# Patient Record
Sex: Female | Born: 1950 | Race: White | Hispanic: No | Smoking: Never smoker
Health system: Southern US, Community
[De-identification: ages and names within clinical notes are randomized; demographics above are authoritative.]

## PROBLEM LIST (undated history)

## (undated) DIAGNOSIS — M779 Enthesopathy, unspecified: Secondary | ICD-10-CM

## (undated) DIAGNOSIS — R112 Nausea with vomiting, unspecified: Secondary | ICD-10-CM

## (undated) DIAGNOSIS — I10 Essential (primary) hypertension: Secondary | ICD-10-CM

## (undated) DIAGNOSIS — F419 Anxiety disorder, unspecified: Secondary | ICD-10-CM

## (undated) DIAGNOSIS — G4730 Sleep apnea, unspecified: Secondary | ICD-10-CM

## (undated) DIAGNOSIS — M1990 Unspecified osteoarthritis, unspecified site: Secondary | ICD-10-CM

## (undated) DIAGNOSIS — K2270 Barrett's esophagus without dysplasia: Secondary | ICD-10-CM

## (undated) DIAGNOSIS — Z9889 Other specified postprocedural states: Secondary | ICD-10-CM

## (undated) DIAGNOSIS — E785 Hyperlipidemia, unspecified: Secondary | ICD-10-CM

## (undated) HISTORY — DX: Hyperlipidemia, unspecified: E78.5

## (undated) HISTORY — DX: Barrett's esophagus without dysplasia: K22.70

## (undated) HISTORY — PX: POLYPECTOMY: SHX149

## (undated) HISTORY — PX: TONSILLECTOMY: SUR1361

## (undated) HISTORY — PX: COLONOSCOPY: SHX174

## (undated) HISTORY — DX: Sleep apnea, unspecified: G47.30

## (undated) HISTORY — DX: Unspecified osteoarthritis, unspecified site: M19.90

---

## 2001-06-30 ENCOUNTER — Encounter: Payer: Self-pay | Admitting: Obstetrics and Gynecology

## 2001-06-30 ENCOUNTER — Encounter: Admission: RE | Admit: 2001-06-30 | Discharge: 2001-06-30 | Payer: Self-pay | Admitting: Obstetrics and Gynecology

## 2001-07-27 ENCOUNTER — Encounter: Payer: Self-pay | Admitting: Obstetrics and Gynecology

## 2001-07-27 ENCOUNTER — Encounter: Admission: RE | Admit: 2001-07-27 | Discharge: 2001-07-27 | Payer: Self-pay | Admitting: Obstetrics and Gynecology

## 2002-07-05 ENCOUNTER — Encounter: Payer: Self-pay | Admitting: Obstetrics and Gynecology

## 2002-07-05 ENCOUNTER — Encounter: Admission: RE | Admit: 2002-07-05 | Discharge: 2002-07-05 | Payer: Self-pay | Admitting: Obstetrics and Gynecology

## 2003-03-01 ENCOUNTER — Other Ambulatory Visit: Admission: RE | Admit: 2003-03-01 | Discharge: 2003-03-01 | Payer: Self-pay | Admitting: Obstetrics and Gynecology

## 2003-08-29 ENCOUNTER — Encounter: Admission: RE | Admit: 2003-08-29 | Discharge: 2003-08-29 | Payer: Self-pay | Admitting: Obstetrics and Gynecology

## 2004-07-01 HISTORY — PX: CHOLECYSTECTOMY: SHX55

## 2004-10-17 ENCOUNTER — Encounter: Admission: RE | Admit: 2004-10-17 | Discharge: 2004-10-17 | Payer: Self-pay | Admitting: Obstetrics and Gynecology

## 2004-10-18 ENCOUNTER — Other Ambulatory Visit: Admission: RE | Admit: 2004-10-18 | Discharge: 2004-10-18 | Payer: Self-pay | Admitting: Obstetrics and Gynecology

## 2004-10-29 ENCOUNTER — Encounter: Admission: RE | Admit: 2004-10-29 | Discharge: 2004-10-29 | Payer: Self-pay | Admitting: Obstetrics and Gynecology

## 2004-12-04 ENCOUNTER — Encounter: Admission: RE | Admit: 2004-12-04 | Discharge: 2004-12-04 | Payer: Self-pay | Admitting: Obstetrics and Gynecology

## 2004-12-19 ENCOUNTER — Encounter: Admission: RE | Admit: 2004-12-19 | Discharge: 2004-12-19 | Payer: Self-pay | Admitting: General Surgery

## 2005-10-28 ENCOUNTER — Encounter: Admission: RE | Admit: 2005-10-28 | Discharge: 2005-10-28 | Payer: Self-pay | Admitting: Obstetrics and Gynecology

## 2005-10-28 ENCOUNTER — Other Ambulatory Visit: Admission: RE | Admit: 2005-10-28 | Discharge: 2005-10-28 | Payer: Self-pay | Admitting: Obstetrics and Gynecology

## 2006-12-30 ENCOUNTER — Encounter: Admission: RE | Admit: 2006-12-30 | Discharge: 2006-12-30 | Payer: Self-pay | Admitting: Obstetrics and Gynecology

## 2006-12-30 ENCOUNTER — Other Ambulatory Visit: Admission: RE | Admit: 2006-12-30 | Discharge: 2006-12-30 | Payer: Self-pay | Admitting: Obstetrics and Gynecology

## 2007-12-31 ENCOUNTER — Encounter: Admission: RE | Admit: 2007-12-31 | Discharge: 2007-12-31 | Payer: Self-pay | Admitting: Obstetrics and Gynecology

## 2007-12-31 ENCOUNTER — Other Ambulatory Visit: Admission: RE | Admit: 2007-12-31 | Discharge: 2007-12-31 | Payer: Self-pay | Admitting: Obstetrics and Gynecology

## 2009-01-03 ENCOUNTER — Other Ambulatory Visit: Admission: RE | Admit: 2009-01-03 | Discharge: 2009-01-03 | Payer: Self-pay | Admitting: Obstetrics and Gynecology

## 2009-01-03 ENCOUNTER — Encounter: Admission: RE | Admit: 2009-01-03 | Discharge: 2009-01-03 | Payer: Self-pay | Admitting: Obstetrics and Gynecology

## 2010-01-09 ENCOUNTER — Encounter: Admission: RE | Admit: 2010-01-09 | Discharge: 2010-01-09 | Payer: Self-pay | Admitting: Obstetrics and Gynecology

## 2010-02-01 ENCOUNTER — Other Ambulatory Visit: Admission: RE | Admit: 2010-02-01 | Discharge: 2010-02-01 | Payer: Self-pay | Admitting: Obstetrics and Gynecology

## 2010-12-17 ENCOUNTER — Other Ambulatory Visit: Payer: Self-pay | Admitting: Obstetrics and Gynecology

## 2010-12-17 DIAGNOSIS — Z1231 Encounter for screening mammogram for malignant neoplasm of breast: Secondary | ICD-10-CM

## 2011-01-11 ENCOUNTER — Ambulatory Visit
Admission: RE | Admit: 2011-01-11 | Discharge: 2011-01-11 | Disposition: A | Discharge status: Home / Self Care | Payer: BC Managed Care – PPO | Source: Ambulatory Visit | Attending: Obstetrics and Gynecology | Admitting: Obstetrics and Gynecology

## 2011-01-11 DIAGNOSIS — Z1231 Encounter for screening mammogram for malignant neoplasm of breast: Secondary | ICD-10-CM

## 2011-02-04 ENCOUNTER — Other Ambulatory Visit: Payer: Self-pay | Admitting: Obstetrics and Gynecology

## 2011-02-04 ENCOUNTER — Other Ambulatory Visit (HOSPITAL_COMMUNITY)
Admission: RE | Admit: 2011-02-04 | Discharge: 2011-02-04 | Disposition: A | Discharge status: Home / Self Care | Payer: BC Managed Care – PPO | Source: Ambulatory Visit | Attending: Obstetrics and Gynecology | Admitting: Obstetrics and Gynecology

## 2011-02-04 DIAGNOSIS — Z01419 Encounter for gynecological examination (general) (routine) without abnormal findings: Secondary | ICD-10-CM | POA: Insufficient documentation

## 2011-12-17 ENCOUNTER — Other Ambulatory Visit: Payer: Self-pay | Admitting: Obstetrics and Gynecology

## 2011-12-17 DIAGNOSIS — Z803 Family history of malignant neoplasm of breast: Secondary | ICD-10-CM

## 2011-12-17 DIAGNOSIS — Z1231 Encounter for screening mammogram for malignant neoplasm of breast: Secondary | ICD-10-CM

## 2012-01-14 ENCOUNTER — Ambulatory Visit: Payer: BC Managed Care – PPO

## 2012-01-30 ENCOUNTER — Ambulatory Visit
Admission: RE | Admit: 2012-01-30 | Discharge: 2012-01-30 | Disposition: A | Discharge status: Home / Self Care | Payer: BC Managed Care – PPO | Source: Ambulatory Visit | Attending: Obstetrics and Gynecology | Admitting: Obstetrics and Gynecology

## 2012-01-30 DIAGNOSIS — Z803 Family history of malignant neoplasm of breast: Secondary | ICD-10-CM

## 2012-01-30 DIAGNOSIS — Z1231 Encounter for screening mammogram for malignant neoplasm of breast: Secondary | ICD-10-CM

## 2012-02-05 ENCOUNTER — Other Ambulatory Visit: Payer: Self-pay | Admitting: Obstetrics and Gynecology

## 2012-02-05 ENCOUNTER — Other Ambulatory Visit (HOSPITAL_COMMUNITY)
Admission: RE | Admit: 2012-02-05 | Discharge: 2012-02-05 | Disposition: A | Discharge status: Home / Self Care | Payer: BC Managed Care – PPO | Source: Ambulatory Visit | Attending: Obstetrics and Gynecology | Admitting: Obstetrics and Gynecology

## 2012-02-05 DIAGNOSIS — R87810 Cervical high risk human papillomavirus (HPV) DNA test positive: Secondary | ICD-10-CM | POA: Insufficient documentation

## 2012-02-05 DIAGNOSIS — Z01419 Encounter for gynecological examination (general) (routine) without abnormal findings: Secondary | ICD-10-CM | POA: Insufficient documentation

## 2012-04-29 ENCOUNTER — Other Ambulatory Visit (HOSPITAL_COMMUNITY): Payer: Self-pay | Admitting: Orthopaedic Surgery

## 2012-06-12 ENCOUNTER — Encounter (HOSPITAL_COMMUNITY): Payer: Self-pay | Admitting: Pharmacy Technician

## 2012-06-15 ENCOUNTER — Encounter (HOSPITAL_COMMUNITY): Payer: Self-pay

## 2012-06-15 ENCOUNTER — Ambulatory Visit (HOSPITAL_COMMUNITY)
Admission: RE | Admit: 2012-06-15 | Discharge: 2012-06-15 | Disposition: A | Discharge status: Home / Self Care | Payer: BC Managed Care – PPO | Source: Ambulatory Visit | Attending: Orthopaedic Surgery | Admitting: Orthopaedic Surgery

## 2012-06-15 ENCOUNTER — Encounter (HOSPITAL_COMMUNITY)
Admission: RE | Admit: 2012-06-15 | Discharge: 2012-06-15 | Disposition: A | Discharge status: Home / Self Care | Payer: BC Managed Care – PPO | Source: Ambulatory Visit | Attending: Orthopaedic Surgery | Admitting: Orthopaedic Surgery

## 2012-06-15 DIAGNOSIS — Z01818 Encounter for other preprocedural examination: Secondary | ICD-10-CM | POA: Insufficient documentation

## 2012-06-15 DIAGNOSIS — Z01812 Encounter for preprocedural laboratory examination: Secondary | ICD-10-CM | POA: Insufficient documentation

## 2012-06-15 HISTORY — DX: Essential (primary) hypertension: I10

## 2012-06-15 HISTORY — DX: Nausea with vomiting, unspecified: R11.2

## 2012-06-15 HISTORY — DX: Anxiety disorder, unspecified: F41.9

## 2012-06-15 HISTORY — DX: Unspecified osteoarthritis, unspecified site: M19.90

## 2012-06-15 HISTORY — DX: Enthesopathy, unspecified: M77.9

## 2012-06-15 HISTORY — DX: Other specified postprocedural states: Z98.890

## 2012-06-15 LAB — URINALYSIS, ROUTINE W REFLEX MICROSCOPIC
Ketones, ur: NEGATIVE mg/dL
Leukocytes, UA: NEGATIVE
Nitrite: NEGATIVE
Protein, ur: NEGATIVE mg/dL

## 2012-06-15 LAB — BASIC METABOLIC PANEL
BUN: 24 mg/dL — ABNORMAL HIGH (ref 6–23)
Calcium: 9.7 mg/dL (ref 8.4–10.5)
GFR calc Af Amer: 74 mL/min — ABNORMAL LOW (ref >90)
GFR calc non Af Amer: 64 mL/min — ABNORMAL LOW (ref >90)
Glucose, Bld: 86 mg/dL (ref 70–99)
Sodium: 136 mEq/L (ref 135–145)

## 2012-06-15 LAB — CBC
MCH: 29.4 pg (ref 26.0–34.0)
MCHC: 32.6 g/dL (ref 30.0–36.0)
Platelets: 249 10*3/uL (ref 150–400)
RBC: 4.28 MIL/uL (ref 3.87–5.11)

## 2012-06-15 NOTE — Progress Notes (Signed)
06/15/12 1401  OBSTRUCTIVE SLEEP APNEA  Have you ever been diagnosed with sleep apnea through a sleep study? No  Do you snore loudly (loud enough to be heard through closed doors)?  1  Do you often feel tired, fatigued, or sleepy during the daytime? 0  Has anyone observed you stop breathing during your sleep? 1  Do you have, or are you being treated for high blood pressure? 1  BMI more than 35 kg/m2? 0  Age over 63 years old? 1  Neck circumference greater than 40 cm/18 inches? 0  Gender: 0  Obstructive Sleep Apnea Score 4   Score 4 or greater  Results sent to PCP

## 2012-06-15 NOTE — Patient Instructions (Signed)
20 LEIGHANNA KIRN  06/15/2012   Your procedure is scheduled on: 06/19/12  Report to Darrin Nipper at [phone].  Call this number if you have problems the morning of surgery 336-: [phone]   Remember:   Do not eat food or drink liquids After Midnight.     Take these medicines the morning of surgery with A SIP OF WATER: amlodipine, tramadol if needed, depakote    Do not wear jewelry, make-up or nail polish.  Do not wear lotions, powders, or perfumes. You may wear deodorant.  Do not shave 48 hours prior to surgery. Men may shave face and neck.  Do not bring valuables to the hospital.  Contacts, dentures or bridgework may not be worn into surgery.  Leave suitcase in the car. After surgery it may be brought to your room.  For patients admitted to the hospital, checkout time is 11:00 AM the day of discharge.     Special Instructions: Shower using CHG 2 nights before surgery and the night before surgery.  If you shower the day of surgery use CHG.  Use special wash - you have one bottle of CHG for all showers.  You should use approximately 1/3 of the bottle for each shower.   Please read over the following fact sheets that you were given: MRSA Information, blood fact sheet Birdie Sons, RN  pre op nurse call if needed [phone]    FAILURE TO FOLLOW THESE INSTRUCTIONS MAY RESULT IN CANCELLATION OF YOUR SURGERY   Patient Signature: ___________________________________________

## 2012-06-19 ENCOUNTER — Encounter (HOSPITAL_COMMUNITY): Payer: Self-pay | Admitting: *Deleted

## 2012-06-19 ENCOUNTER — Encounter (HOSPITAL_COMMUNITY)
Admission: RE | Disposition: A | Discharge status: Home Health | Payer: Self-pay | Source: Ambulatory Visit | Attending: Orthopaedic Surgery

## 2012-06-19 ENCOUNTER — Inpatient Hospital Stay (HOSPITAL_COMMUNITY): Payer: BC Managed Care – PPO | Admitting: Certified Registered Nurse Anesthetist

## 2012-06-19 ENCOUNTER — Inpatient Hospital Stay (HOSPITAL_COMMUNITY)
Admission: RE | Admit: 2012-06-19 | Discharge: 2012-06-21 | DRG: 818 | Disposition: A | Discharge status: Home Health | Payer: BC Managed Care – PPO | Source: Ambulatory Visit | Attending: Orthopaedic Surgery | Admitting: Orthopaedic Surgery

## 2012-06-19 ENCOUNTER — Inpatient Hospital Stay (HOSPITAL_COMMUNITY): Payer: BC Managed Care – PPO

## 2012-06-19 ENCOUNTER — Encounter (HOSPITAL_COMMUNITY): Payer: Self-pay | Admitting: Certified Registered Nurse Anesthetist

## 2012-06-19 DIAGNOSIS — Z791 Long term (current) use of non-steroidal anti-inflammatories (NSAID): Secondary | ICD-10-CM

## 2012-06-19 DIAGNOSIS — Z7982 Long term (current) use of aspirin: Secondary | ICD-10-CM

## 2012-06-19 DIAGNOSIS — M1610 Unilateral primary osteoarthritis, unspecified hip: Principal | ICD-10-CM | POA: Diagnosis present

## 2012-06-19 DIAGNOSIS — F429 Obsessive-compulsive disorder, unspecified: Secondary | ICD-10-CM | POA: Diagnosis present

## 2012-06-19 DIAGNOSIS — Z880 Allergy status to penicillin: Secondary | ICD-10-CM

## 2012-06-19 DIAGNOSIS — D62 Acute posthemorrhagic anemia: Secondary | ICD-10-CM | POA: Diagnosis not present

## 2012-06-19 DIAGNOSIS — Z9089 Acquired absence of other organs: Secondary | ICD-10-CM

## 2012-06-19 DIAGNOSIS — I10 Essential (primary) hypertension: Secondary | ICD-10-CM | POA: Diagnosis present

## 2012-06-19 DIAGNOSIS — Z79899 Other long term (current) drug therapy: Secondary | ICD-10-CM

## 2012-06-19 DIAGNOSIS — M169 Osteoarthritis of hip, unspecified: Secondary | ICD-10-CM | POA: Diagnosis present

## 2012-06-19 DIAGNOSIS — M5380 Other specified dorsopathies, site unspecified: Secondary | ICD-10-CM | POA: Diagnosis present

## 2012-06-19 HISTORY — PX: TOTAL HIP ARTHROPLASTY: SHX124

## 2012-06-19 LAB — TYPE AND SCREEN: Antibody Screen: NEGATIVE

## 2012-06-19 LAB — ABO/RH: ABO/RH(D): AB POS

## 2012-06-19 SURGERY — ARTHROPLASTY, HIP, TOTAL, ANTERIOR APPROACH
Anesthesia: General | Site: Hip | Laterality: Right | Wound class: Clean

## 2012-06-19 MED ORDER — DOCUSATE SODIUM 100 MG PO CAPS
100.0000 mg | ORAL_CAPSULE | Freq: Two times a day (BID) | ORAL | Status: DC
  Administered 2012-06-19 – 2012-06-21 (×4): 100 mg via ORAL

## 2012-06-19 MED ORDER — ATORVASTATIN CALCIUM 40 MG PO TABS
40.0000 mg | ORAL_TABLET | Freq: Every evening | ORAL | Status: DC
  Administered 2012-06-19 – 2012-06-20 (×2): 40 mg via ORAL
  Filled 2012-06-19 (×3): qty 1

## 2012-06-19 MED ORDER — HYDROMORPHONE HCL PF 1 MG/ML IJ SOLN
INTRAMUSCULAR | Status: AC
  Filled 2012-06-19: qty 1

## 2012-06-19 MED ORDER — METOCLOPRAMIDE HCL 10 MG PO TABS: 5.0000 mg | ORAL_TABLET | Freq: Three times a day (TID) | ORAL | Status: DC | PRN

## 2012-06-19 MED ORDER — ZOLPIDEM TARTRATE 5 MG PO TABS: 5.0000 mg | ORAL_TABLET | Freq: Every evening | ORAL | Status: DC | PRN

## 2012-06-19 MED ORDER — EPHEDRINE SULFATE 50 MG/ML IJ SOLN
INTRAMUSCULAR | Status: DC | PRN
  Administered 2012-06-19: 5 mg via INTRAVENOUS

## 2012-06-19 MED ORDER — HYDROMORPHONE HCL PF 1 MG/ML IJ SOLN: 0.2500 mg | INTRAMUSCULAR | Status: DC | PRN

## 2012-06-19 MED ORDER — MIDAZOLAM HCL 5 MG/5ML IJ SOLN: INTRAMUSCULAR | Status: DC | PRN

## 2012-06-19 MED ORDER — OXYCODONE HCL ER 10 MG PO T12A
10.0000 mg | EXTENDED_RELEASE_TABLET | Freq: Two times a day (BID) | ORAL | Status: DC
  Administered 2012-06-19 – 2012-06-21 (×4): 10 mg via ORAL
  Filled 2012-06-19 (×4): qty 1

## 2012-06-19 MED ORDER — FENTANYL CITRATE 0.05 MG/ML IJ SOLN
INTRAMUSCULAR | Status: DC | PRN
  Administered 2012-06-19: 100 ug via INTRAVENOUS
  Administered 2012-06-19 (×2): 50 ug via INTRAVENOUS

## 2012-06-19 MED ORDER — SODIUM CHLORIDE 0.9 % IV SOLN
INTRAVENOUS | Status: DC
  Administered 2012-06-19: 12:00:00 via INTRAVENOUS

## 2012-06-19 MED ORDER — DIPHENHYDRAMINE HCL 12.5 MG/5ML PO ELIX: 12.5000 mg | ORAL_SOLUTION | ORAL | Status: DC | PRN

## 2012-06-19 MED ORDER — ONDANSETRON HCL 4 MG/2ML IJ SOLN: 4.0000 mg | Freq: Four times a day (QID) | INTRAMUSCULAR | Status: DC | PRN

## 2012-06-19 MED ORDER — METOCLOPRAMIDE HCL 5 MG/ML IJ SOLN
INTRAMUSCULAR | Status: DC | PRN
  Administered 2012-06-19: 5 mg via INTRAVENOUS

## 2012-06-19 MED ORDER — METOCLOPRAMIDE HCL 5 MG/ML IJ SOLN: 5.0000 mg | Freq: Three times a day (TID) | INTRAMUSCULAR | Status: DC | PRN

## 2012-06-19 MED ORDER — KETOROLAC TROMETHAMINE 15 MG/ML IJ SOLN
15.0000 mg | Freq: Four times a day (QID) | INTRAMUSCULAR | Status: AC
  Administered 2012-06-19 – 2012-06-20 (×4): 15 mg via INTRAVENOUS
  Filled 2012-06-19 (×4): qty 1

## 2012-06-19 MED ORDER — ACETAMINOPHEN 10 MG/ML IV SOLN
INTRAVENOUS | Status: DC | PRN
  Administered 2012-06-19: 1000 mg via INTRAVENOUS

## 2012-06-19 MED ORDER — ESCITALOPRAM OXALATE 20 MG PO TABS
20.0000 mg | ORAL_TABLET | Freq: Every evening | ORAL | Status: DC
  Administered 2012-06-19 – 2012-06-20 (×2): 20 mg via ORAL
  Filled 2012-06-19 (×3): qty 1

## 2012-06-19 MED ORDER — MENTHOL 3 MG MT LOZG: 1.0000 | LOZENGE | OROMUCOSAL | Status: DC | PRN

## 2012-06-19 MED ORDER — OXYCODONE HCL 5 MG PO TABS
5.0000 mg | ORAL_TABLET | ORAL | Status: DC | PRN
  Administered 2012-06-20 (×2): 5 mg via ORAL
  Filled 2012-06-19: qty 2
  Filled 2012-06-19: qty 1

## 2012-06-19 MED ORDER — PROPOFOL 10 MG/ML IV BOLUS
INTRAVENOUS | Status: DC | PRN
  Administered 2012-06-19: 140 mg via INTRAVENOUS

## 2012-06-19 MED ORDER — METHOCARBAMOL 100 MG/ML IJ SOLN
500.0000 mg | Freq: Four times a day (QID) | INTRAVENOUS | Status: DC | PRN
  Administered 2012-06-19: 500 mg via INTRAVENOUS
  Filled 2012-06-19: qty 5

## 2012-06-19 MED ORDER — SUCCINYLCHOLINE CHLORIDE 20 MG/ML IJ SOLN
INTRAMUSCULAR | Status: DC | PRN
  Administered 2012-06-19: 100 mg via INTRAVENOUS

## 2012-06-19 MED ORDER — PROMETHAZINE HCL 25 MG/ML IJ SOLN: 6.2500 mg | INTRAMUSCULAR | Status: DC | PRN

## 2012-06-19 MED ORDER — ONDANSETRON HCL 4 MG/2ML IJ SOLN
INTRAMUSCULAR | Status: DC | PRN
  Administered 2012-06-19: 4 mg via INTRAVENOUS

## 2012-06-19 MED ORDER — DIVALPROEX SODIUM ER 500 MG PO TB24
500.0000 mg | ORAL_TABLET | Freq: Every day | ORAL | Status: DC
  Administered 2012-06-20 – 2012-06-21 (×2): 500 mg via ORAL
  Filled 2012-06-19 (×3): qty 1

## 2012-06-19 MED ORDER — ONDANSETRON HCL 4 MG PO TABS: 4.0000 mg | ORAL_TABLET | Freq: Four times a day (QID) | ORAL | Status: DC | PRN

## 2012-06-19 MED ORDER — ACETAMINOPHEN 650 MG RE SUPP: 650.0000 mg | Freq: Four times a day (QID) | RECTAL | Status: DC | PRN

## 2012-06-19 MED ORDER — 0.9 % SODIUM CHLORIDE (POUR BTL) OPTIME
TOPICAL | Status: DC | PRN
  Administered 2012-06-19: 1000 mL

## 2012-06-19 MED ORDER — ASPIRIN EC 325 MG PO TBEC
325.0000 mg | DELAYED_RELEASE_TABLET | Freq: Two times a day (BID) | ORAL | Status: DC
  Administered 2012-06-20 – 2012-06-21 (×3): 325 mg via ORAL
  Filled 2012-06-19 (×5): qty 1

## 2012-06-19 MED ORDER — METHOCARBAMOL 500 MG PO TABS
500.0000 mg | ORAL_TABLET | Freq: Four times a day (QID) | ORAL | Status: DC | PRN
  Administered 2012-06-19: 500 mg via ORAL
  Filled 2012-06-19: qty 1

## 2012-06-19 MED ORDER — ALUM & MAG HYDROXIDE-SIMETH 200-200-20 MG/5ML PO SUSP: 30.0000 mL | ORAL | Status: DC | PRN

## 2012-06-19 MED ORDER — PHENOL 1.4 % MT LIQD: 1.0000 | OROMUCOSAL | Status: DC | PRN

## 2012-06-19 MED ORDER — LIDOCAINE HCL 1 % IJ SOLN
INTRAMUSCULAR | Status: DC | PRN
  Administered 2012-06-19: 80 mg via INTRADERMAL

## 2012-06-19 MED ORDER — MIDAZOLAM HCL 5 MG/5ML IJ SOLN
INTRAMUSCULAR | Status: DC | PRN
  Administered 2012-06-19: 2 mg via INTRAVENOUS

## 2012-06-19 MED ORDER — HYDROMORPHONE HCL PF 1 MG/ML IJ SOLN
1.0000 mg | INTRAMUSCULAR | Status: DC | PRN
  Administered 2012-06-19: 0.5 mg via INTRAVENOUS

## 2012-06-19 MED ORDER — AMLODIPINE BESYLATE 5 MG PO TABS
5.0000 mg | ORAL_TABLET | Freq: Every day | ORAL | Status: DC
  Administered 2012-06-21: 5 mg via ORAL
  Filled 2012-06-19 (×3): qty 1

## 2012-06-19 MED ORDER — CLINDAMYCIN PHOSPHATE 900 MG/50ML IV SOLN
900.0000 mg | INTRAVENOUS | Status: AC
  Administered 2012-06-19: 900 mg via INTRAVENOUS

## 2012-06-19 MED ORDER — CLINDAMYCIN PHOSPHATE 600 MG/50ML IV SOLN
600.0000 mg | Freq: Four times a day (QID) | INTRAVENOUS | Status: AC
  Administered 2012-06-19 (×2): 600 mg via INTRAVENOUS
  Filled 2012-06-19 (×2): qty 50

## 2012-06-19 MED ORDER — DEXAMETHASONE SODIUM PHOSPHATE 10 MG/ML IJ SOLN
INTRAMUSCULAR | Status: DC | PRN
  Administered 2012-06-19: 10 mg via INTRAVENOUS

## 2012-06-19 MED ORDER — ACETAMINOPHEN 325 MG PO TABS
650.0000 mg | ORAL_TABLET | Freq: Four times a day (QID) | ORAL | Status: DC | PRN
  Administered 2012-06-20 – 2012-06-21 (×2): 650 mg via ORAL
  Filled 2012-06-19 (×2): qty 2

## 2012-06-19 MED ORDER — LACTATED RINGERS IV SOLN
INTRAVENOUS | Status: DC | PRN
  Administered 2012-06-19: 08:00:00 via INTRAVENOUS
  Administered 2012-06-19: 1000 mL
  Administered 2012-06-19: 07:00:00 via INTRAVENOUS

## 2012-06-19 MED ORDER — CISATRACURIUM BESYLATE (PF) 10 MG/5ML IV SOLN
INTRAVENOUS | Status: DC | PRN
  Administered 2012-06-19: 6 mg via INTRAVENOUS

## 2012-06-19 SURGICAL SUPPLY — 36 items
BAG ZIPLOCK 12X15 (MISCELLANEOUS) ×4 IMPLANT
BLADE SAW SGTL 18X1.27X75 (BLADE) ×2 IMPLANT
CELLS DAT CNTRL 66122 CELL SVR (MISCELLANEOUS) ×1 IMPLANT
CLOTH BEACON ORANGE TIMEOUT ST (SAFETY) ×2 IMPLANT
DRAPE C-ARM 42X72 X-RAY (DRAPES) ×2 IMPLANT
DRAPE STERI IOBAN 125X83 (DRAPES) ×2 IMPLANT
DRAPE U-SHAPE 47X51 STRL (DRAPES) ×6 IMPLANT
DRSG MEPILEX BORDER 4X8 (GAUZE/BANDAGES/DRESSINGS) ×2 IMPLANT
DURAPREP 26ML APPLICATOR (WOUND CARE) ×2 IMPLANT
ELECT BLADE TIP CTD 4 INCH (ELECTRODE) ×2 IMPLANT
ELECT REM PT RETURN 9FT ADLT (ELECTROSURGICAL) ×2
ELECTRODE REM PT RTRN 9FT ADLT (ELECTROSURGICAL) ×1 IMPLANT
FACESHIELD LNG OPTICON STERILE (SAFETY) ×8 IMPLANT
GAUZE XEROFORM 1X8 LF (GAUZE/BANDAGES/DRESSINGS) ×2 IMPLANT
GLOVE BIO SURGEON STRL SZ7 (GLOVE) ×2 IMPLANT
GLOVE BIO SURGEON STRL SZ7.5 (GLOVE) ×2 IMPLANT
GLOVE BIOGEL PI IND STRL 7.5 (GLOVE) IMPLANT
GLOVE BIOGEL PI IND STRL 8 (GLOVE) ×1 IMPLANT
GLOVE BIOGEL PI INDICATOR 7.5 (GLOVE)
GLOVE BIOGEL PI INDICATOR 8 (GLOVE) ×1
GLOVE ECLIPSE 7.0 STRL STRAW (GLOVE) ×2 IMPLANT
GOWN STRL REIN XL XLG (GOWN DISPOSABLE) ×4 IMPLANT
KIT BASIN OR (CUSTOM PROCEDURE TRAY) ×2 IMPLANT
PACK TOTAL JOINT (CUSTOM PROCEDURE TRAY) ×2 IMPLANT
PADDING CAST COTTON 6X4 STRL (CAST SUPPLIES) ×2 IMPLANT
RTRCTR WOUND ALEXIS 18CM MED (MISCELLANEOUS) ×2
STAPLER VISISTAT 35W (STAPLE) ×2 IMPLANT
SUT ETHIBOND NAB CT1 #1 30IN (SUTURE) ×4 IMPLANT
SUT VIC AB 1 CT1 36 (SUTURE) IMPLANT
SUT VIC AB 2-0 CT1 27 (SUTURE) ×1
SUT VIC AB 2-0 CT1 TAPERPNT 27 (SUTURE) ×1 IMPLANT
SUT VLOC 180 0 24IN GS25 (SUTURE) ×4 IMPLANT
TOWEL OR 17X26 10 PK STRL BLUE (TOWEL DISPOSABLE) ×4 IMPLANT
TOWEL OR NON WOVEN STRL DISP B (DISPOSABLE) ×2 IMPLANT
TRAY FOLEY CATH 14FRSI W/METER (CATHETERS) ×2 IMPLANT
WATER STERILE IRR 1500ML POUR (IV SOLUTION) ×4 IMPLANT

## 2012-06-19 NOTE — Anesthesia Postprocedure Evaluation (Signed)
  Anesthesia Post-op Note  Patient: Pamela Wheeler  Procedure(s) Performed: Procedure(s) (LRB): TOTAL HIP ARTHROPLASTY ANTERIOR APPROACH (Right)  Patient Location: PACU  Anesthesia Type: General  Level of Consciousness: awake and alert   Airway and Oxygen Therapy: Patient Spontanous Breathing  Post-op Pain: mild  Post-op Assessment: Post-op Vital signs reviewed, Patient's Cardiovascular Status Stable, Respiratory Function Stable, Patent Airway and No signs of Nausea or vomiting  Last Vitals:  Filed Vitals:   06/19/12 0945  BP: 95/40  Pulse: 93  Temp:   Resp: 18    Post-op Vital Signs: stable   Complications: No apparent anesthesia complications

## 2012-06-19 NOTE — Transfer of Care (Signed)
Immediate Anesthesia Transfer of Care Note  Patient: Pamela Wheeler  Procedure(s) Performed: Procedure(s) (LRB) with comments: TOTAL HIP ARTHROPLASTY ANTERIOR APPROACH (Right) - Right Total Hip Arthroplasty, Anterior Approach (C-Arm)  Patient Location: PACU  Anesthesia Type:General  Level of Consciousness: awake  Airway & Oxygen Therapy: Patient connected to face mask oxygen  Post-op Assessment: Report given to PACU RN  Post vital signs: Reviewed and stable  Complications: No apparent anesthesia complications

## 2012-06-19 NOTE — H&P (Signed)
TOTAL HIP ADMISSION H&P  Patient is admitted for left total hip arthroplasty.  Subjective:  Chief Complaint: left hip pain  HPI: Pamela Wheeler, 61 y.o. female, has a history of pain and functional disability in the left hip(s) due to arthritis and patient has failed non-surgical conservative treatments for greater than 12 weeks to include NSAID's and/or analgesics, corticosteriod injections, use of assistive devices and activity modification.  Onset of symptoms was gradual starting 5 years ago with gradually worsening course since that time.The patient noted no past surgery on the left hip(s).  Patient currently rates pain in the left hip at 8 out of 10 with activity. Patient has night pain, worsening of pain with activity and weight bearing, pain that interfers with activities of daily living and pain with passive range of motion. Patient has evidence of subchondral cysts, subchondral sclerosis, periarticular osteophytes and joint space narrowing by imaging studies. This condition presents safety issues increasing the risk of falls..  There is no current active infection.  Patient Active Problem List   Diagnosis Date Noted  . Degenerative arthritis of hip 06/19/2012   Past Medical History  Diagnosis Date  . Hypertension   . Anxiety     OCD  . Arthritis   . Bone spur of other site     spine,  l-5  . PONV (postoperative nausea and vomiting)     Past Surgical History  Procedure Date  . Cholecystectomy 2006  . Tonsillectomy     Prescriptions prior to admission  Medication Sig Dispense Refill  . amLODipine (NORVASC) 5 MG tablet Take 5 mg by mouth daily before breakfast.      . aspirin EC 81 MG tablet Take 81 mg by mouth daily.      . Calcium Carbonate-Vitamin D (CALTRATE 600+D PO) Take 1 tablet by mouth 2 (two) times daily.      . Coenzyme Q10 (CO Q 10 PO) Take 1 tablet by mouth every evening.       . divalproex (DEPAKOTE ER) 500 MG 24 hr tablet Take 500 mg by mouth daily.       Marland Kitchen escitalopram (LEXAPRO) 20 MG tablet Take 20 mg by mouth every evening.      . Multiple Vitamins-Minerals (CENTRUM SILVER PO) Take 1 tablet by mouth daily.      . traMADol (ULTRAM) 50 MG tablet Take 50 mg by mouth daily.      Marland Kitchen atorvastatin (LIPITOR) 40 MG tablet Take 40 mg by mouth every evening.      . diclofenac (VOLTAREN) 75 MG EC tablet Take 75 mg by mouth daily.       Allergies  Allergen Reactions  . Penicillins Other (See Comments)    UNKNOWN    History  Substance Use Topics  . Smoking status: Never Smoker   . Smokeless tobacco: Never Used  . Alcohol Use: No    History reviewed. No pertinent family history.   Review of Systems  Musculoskeletal: Positive for joint pain.  All other systems reviewed and are negative.    Objective:  Physical Exam  Constitutional: She is oriented to person, place, and time. She appears well-developed and well-nourished.  HENT:  Head: Normocephalic and atraumatic.  Eyes: EOM are normal.  Neck: Normal range of motion. Neck supple.  Cardiovascular: Normal rate and regular rhythm.   Respiratory: Effort normal and breath sounds normal.  GI: Soft. Bowel sounds are normal.  Musculoskeletal:       Left hip: She exhibits decreased range  of motion, decreased strength, bony tenderness and crepitus.  Neurological: She is alert and oriented to person, place, and time.  Skin: Skin is warm and dry.  Psychiatric: She has a normal mood and affect.    Vital signs in last 24 hours: Temp:  [98.2 F (36.8 C)] 98.2 F (36.8 C) (12/20 0515) Pulse Rate:  [89] 89  (12/20 0515) Resp:  [18] 18  (12/20 0515) BP: (126)/(50) 126/50 mmHg (12/20 0515) SpO2:  [98 %] 98 % (12/20 0515)  Labs:   There is no height or weight on file to calculate BMI.   Imaging Review Plain radiographs demonstrate severe degenerative joint disease of the left hip(s). The bone quality appears to be excellent for age and reported activity level.  Assessment/Plan:  End  stage arthritis, left hip(s)  The patient history, physical examination, clinical judgement of the provider and imaging studies are consistent with end stage degenerative joint disease of the left hip(s) and total hip arthroplasty is deemed medically necessary. The treatment options including medical management, injection therapy, arthroscopy and arthroplasty were discussed at length. The risks and benefits of total hip arthroplasty were presented and reviewed. The risks due to aseptic loosening, infection, stiffness, dislocation/subluxation,  thromboembolic complications and other imponderables were discussed.  The patient acknowledged the explanation, agreed to proceed with the plan and consent was signed. Patient is being admitted for inpatient treatment for surgery, pain control, PT, OT, prophylactic antibiotics, VTE prophylaxis, progressive ambulation and ADL's and discharge planning.The patient is planning to be discharged home with home health services

## 2012-06-19 NOTE — Anesthesia Procedure Notes (Signed)
Procedure Name: Intubation Date/Time: 06/19/2012 7:32 AM Performed by: Hulan Fess Pre-anesthesia Checklist: Patient identified, Emergency Drugs available, Suction available, Patient being monitored and Timeout performed Patient Re-evaluated:Patient Re-evaluated prior to inductionOxygen Delivery Method: Circle system utilized Preoxygenation: Pre-oxygenation with 100% oxygen Intubation Type: IV induction Ventilation: Mask ventilation without difficulty Laryngoscope Size: Mac and 3 Grade View: Grade II Tube type: Oral Tube size: 7.5 mm Number of attempts: 2 Airway Equipment and Method: Stylet Placement Confirmation: ETT inserted through vocal cords under direct vision and positive ETCO2 Secured at: 21 cm Tube secured with: Tape Dental Injury: Teeth and Oropharynx as per pre-operative assessment

## 2012-06-19 NOTE — Plan of Care (Signed)
Problem: Consults Goal: Diagnosis- Total Joint Replacement Right anterior hip     

## 2012-06-19 NOTE — H&P (Signed)
TOTAL HIP ADMISSION H&P  Patient is admitted for right total hip arthroplasty.  Subjective:  Chief Complaint: right hip pain  HPI: Pamela Wheeler, 61 y.o. female, has a history of pain and functional disability in the right hip(s) due to arthritis and patient has failed non-surgical conservative treatments for greater than 12 weeks to include NSAID's and/or analgesics and activity modification.  Onset of symptoms was gradual starting 4 years ago with gradually worsening course since that time.The patient noted no past surgery on the right hip(s).  Patient currently rates pain in the right hip at 10 out of 10 with activity. Patient has night pain, worsening of pain with activity and weight bearing, trendelenberg gait, pain that interfers with activities of daily living, pain with passive range of motion and crepitus. Patient has evidence of subchondral cysts, subchondral sclerosis, periarticular osteophytes and joint space narrowing by imaging studies. This condition presents safety issues increasing the risk of falls.  There is no current active infection.  Patient Active Problem List   Diagnosis Date Noted  . Degenerative arthritis of hip 06/19/2012   Past Medical History  Diagnosis Date  . Hypertension   . Anxiety     OCD  . Arthritis   . Bone spur of other site     spine,  l-5  . PONV (postoperative nausea and vomiting)     Past Surgical History  Procedure Date  . Cholecystectomy 2006  . Tonsillectomy     Prescriptions prior to admission  Medication Sig Dispense Refill  . amLODipine (NORVASC) 5 MG tablet Take 5 mg by mouth daily before breakfast.      . aspirin EC 81 MG tablet Take 81 mg by mouth daily.      . Calcium Carbonate-Vitamin D (CALTRATE 600+D PO) Take 1 tablet by mouth 2 (two) times daily.      . Coenzyme Q10 (CO Q 10 PO) Take 1 tablet by mouth every evening.       . divalproex (DEPAKOTE ER) 500 MG 24 hr tablet Take 500 mg by mouth daily.      Marland Kitchen escitalopram  (LEXAPRO) 20 MG tablet Take 20 mg by mouth every evening.      . Multiple Vitamins-Minerals (CENTRUM SILVER PO) Take 1 tablet by mouth daily.      . traMADol (ULTRAM) 50 MG tablet Take 50 mg by mouth daily.      Marland Kitchen atorvastatin (LIPITOR) 40 MG tablet Take 40 mg by mouth every evening.      . diclofenac (VOLTAREN) 75 MG EC tablet Take 75 mg by mouth daily.       Allergies  Allergen Reactions  . Penicillins Other (See Comments)    UNKNOWN    History  Substance Use Topics  . Smoking status: Never Smoker   . Smokeless tobacco: Never Used  . Alcohol Use: No    History reviewed. No pertinent family history.   Review of Systems  Musculoskeletal: Positive for joint pain.  All other systems reviewed and are negative.    Objective:  Physical Exam  Constitutional: She is oriented to person, place, and time. She appears well-developed and well-nourished.  HENT:  Head: Normocephalic and atraumatic.  Eyes: EOM are normal. Pupils are equal, round, and reactive to light.  Neck: Normal range of motion. Neck supple.  Cardiovascular: Normal rate and regular rhythm.   Respiratory: Effort normal and breath sounds normal.  GI: Soft. Bowel sounds are normal.  Musculoskeletal:       Right  hip: She exhibits decreased range of motion, decreased strength, bony tenderness and crepitus.  Neurological: She is alert and oriented to person, place, and time.  Skin: Skin is warm and dry.  Psychiatric: She has a normal mood and affect.    Vital signs in last 24 hours: Temp:  [98.2 F (36.8 C)] 98.2 F (36.8 C) (12/20 0515) Pulse Rate:  [89] 89  (12/20 0515) Resp:  [18] 18  (12/20 0515) BP: (126)/(50) 126/50 mmHg (12/20 0515) SpO2:  [98 %] 98 % (12/20 0515)  Labs:   There is no height or weight on file to calculate BMI.   Imaging Review Plain radiographs demonstrate severe degenerative joint disease of the right hip(s). The bone quality appears to be excellent for age and reported activity  level.  Assessment/Plan:  End stage arthritis, right hip(s)  The patient history, physical examination, clinical judgement of the provider and imaging studies are consistent with end stage degenerative joint disease of the right hip(s) and total hip arthroplasty is deemed medically necessary. The treatment options including medical management, injection therapy, arthroscopy and arthroplasty were discussed at length. The risks and benefits of total hip arthroplasty were presented and reviewed. The risks due to aseptic loosening, infection, stiffness, dislocation/subluxation,  thromboembolic complications and other imponderables were discussed.  The patient acknowledged the explanation, agreed to proceed with the plan and consent was signed. Patient is being admitted for inpatient treatment for surgery, pain control, PT, OT, prophylactic antibiotics, VTE prophylaxis, progressive ambulation and ADL's and discharge planning.The patient is planning to be discharged home with home health services

## 2012-06-19 NOTE — Evaluation (Signed)
Physical Therapy Evaluation Patient Details Name: Pamela Wheeler MRN: [id number] DOB: 09-25-1950 Today's Date: 06/19/2012 Time: 7829-5621 PT Time Calculation (min): 37 min  PT Assessment / Plan / Recommendation Clinical Impression  Pt s/p R THR presents with decreased R LE strength/ROM and post op pain limiting functional mobility    PT Assessment  Patient needs continued PT services    Follow Up Recommendations  Home health PT    Does the patient have the potential to tolerate intense rehabilitation      Barriers to Discharge None      Equipment Recommendations  None recommended by PT    Recommendations for Other Services OT consult   Frequency 7X/week    Precautions / Restrictions Precautions Precautions: Fall Restrictions Weight Bearing Restrictions: No Other Position/Activity Restrictions: WBAT   Pertinent Vitals/Pain 3/10      Mobility  Bed Mobility Bed Mobility: Supine to Sit Supine to Sit: 4: Min assist Details for Bed Mobility Assistance: cues for sequence and use of L LE to self assist Transfers Transfers: Stand to Sit;Sit to Stand Sit to Stand: 4: Min assist Stand to Sit: 4: Min assist Details for Transfer Assistance: cues for use of UEs and for LE management Ambulation/Gait Ambulation/Gait Assistance: 4: Min assist Ambulation Distance (Feet): 78 Feet Assistive device: Rolling walker Ambulation/Gait Assistance Details: cues for sequence, posture, position from RW Gait Pattern: Step-to pattern    Shoulder Instructions     Exercises Total Joint Exercises Ankle Circles/Pumps: AROM;10 reps;Supine;Both Quad Sets: AROM;10 reps;Both;Supine Heel Slides: AAROM;10 reps;Supine;Right Hip ABduction/ADduction: AAROM;Right;10 reps;Supine   PT Diagnosis: Difficulty walking  PT Problem List: Decreased strength;Decreased range of motion;Decreased activity tolerance;Decreased mobility;Decreased knowledge of use of DME;Pain PT Treatment Interventions:  DME instruction;Gait training;Stair training;Functional mobility training;Therapeutic activities;Therapeutic exercise;Patient/family education   PT Goals Acute Rehab PT Goals PT Goal Formulation: With patient Time For Goal Achievement: 06/23/12 Potential to Achieve Goals: Good Pt will go Supine/Side to Sit: with supervision PT Goal: Supine/Side to Sit - Progress: Goal set today Pt will go Sit to Supine/Side: with supervision PT Goal: Sit to Supine/Side - Progress: Goal set today Pt will go Sit to Stand: with supervision PT Goal: Sit to Stand - Progress: Goal set today Pt will go Stand to Sit: with supervision PT Goal: Stand to Sit - Progress: Goal set today Pt will Ambulate: >150 feet;with supervision;with rolling walker PT Goal: Ambulate - Progress: Goal set today Pt will Go Up / Down Stairs: 1-2 stairs;with min assist;with least restrictive assistive device PT Goal: Up/Down Stairs - Progress: Goal set today  Visit Information  Last PT Received On: 06/19/12 Assistance Needed: +1    Subjective Data  Subjective: I am motivated.  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  Home Living Lives With: Spouse Available Help at Discharge: Family Type of Home: House Home Access: Stairs to enter Secretary/administrator of Steps: 2 Entrance Stairs-Rails: None Home Adaptive Equipment: Walker - rolling Prior Function Level of Independence: Independent Able to Take Stairs?: Yes Driving: Yes Communication Communication: No difficulties Dominant Hand: Right    Cognition  Overall Cognitive Status: Appears within functional limits for tasks assessed/performed Arousal/Alertness: Awake/alert Orientation Level: Appears intact for tasks assessed Behavior During Session: [facility] for tasks performed    Extremity/Trunk Assessment Right Upper Extremity Assessment RUE ROM/Strength/Tone: [facility] for tasks assessed Left Upper Extremity Assessment LUE  ROM/Strength/Tone: [facility] for tasks assessed Right Lower Extremity Assessment RLE ROM/Strength/Tone: Deficits RLE ROM/Strength/Tone  Deficits: Hip strength 3-/5 with AAROM to 80 flex and 20 abd Left Lower Extremity Assessment LLE ROM/Strength/Tone: WFL for tasks assessed   Balance    End of Session PT - End of Session Equipment Utilized During Treatment: Gait belt Activity Tolerance: Patient tolerated treatment well Patient left: in bed;with call bell/phone within reach Nurse Communication: Mobility status  GP     Pierrette Scheu 06/19/2012, 5:36 PM

## 2012-06-19 NOTE — Op Note (Signed)
Pamela, Wheeler         ACCOUNT NO.:  [id number]  MEDICAL RECORD NO.:  [id number]  LOCATION:  1610                         FACILITY:  [facility]  PHYSICIAN:  Vanita Panda. Magnus Ivan, M.D.DATE OF BIRTH:  [birth year]  DATE OF PROCEDURE:  06/19/2012 DATE OF DISCHARGE:                              OPERATIVE REPORT   PREOPERATIVE DIAGNOSIS:  Severe end-stage arthritis and degenerative joint disease, right hip.  POSTOPERATIVE DIAGNOSIS:  Severe end-stage arthritis and degenerative joint disease, right hip.  PROCEDURE:  Right total hip arthroplasty through direct anterior approach.  IMPLANTS:  DePuy Sector Gription acetabular component size 48, size 32+ 4 neutral polyethylene liner, Corail femoral component with varus offset (KLA), size 32+ 1 ceramic hip ball.  SURGEON:  Vanita Panda. Magnus Ivan, MD  ANESTHESIA:  General.  ANTIBIOTICS:  2 g IV Ancef.  BLOOD LOSS:  Less than 500 mL.  COMPLICATIONS:  None.  INDICATIONS:  Pamela Wheeler is a 61 year old with debilitating pain involving her right hip.  It affects her activities of daily living. She has had several falls.  X-rays show complete loss of her joint space with subchondral sclerosis and periarticular osteophytes.  There is also subchondral cyst.  At this point, she wished to proceed with total hip arthroplasty due to her daily pain and her decreased mobility.  Risks and benefits of surgery well explained to her and understood.  She does agree to proceed.  PROCEDURE DESCRIPTION:  After informed consent was obtained, appropriate right hip was marked.  She was brought to the operating room.  General anesthesia was obtained while she was on a stretcher.  Foley catheter was placed and then both feet were placed in traction boot.  She was then placed supine on the Hana fracture table with the perineal post in place and both feet in inline skeletal traction but no traction applied. We used fluoroscopy to assess the hip  given the center of the pelvis and assessing the right hip, so we could obtain leg lengths and positioning of the implants.  We then prepped the right hip with DuraPrep and sterile drapes.  A time-out was called.  She was identified as correct patient, correct right hip.  I then made an incision inferior and posterior to the anterior-superior iliac spine and carried this obliquely down the leg.  I dissected down to the tensor fascia lata and tensor fascia was divided longitudinally.  I then proceeded with a direct anterior approach to the hip.  Cobra retractor was placed in lateral neck and 1 up underneath the medial neck and under the rectus femoris muscle.  I cauterized the lateral femoral circumflex vessels.  I then entered the hip capsule and found large effusion.  I placed the Cobra retractors within the hip capsule.  I then made my femoral neck cut just proximal to the lesser trochanter with an oscillating saw and completed this with an osteotome.  I placed a corkscrew guide in the femoral head and removed the femoral head in its entirety.  I cleaned the acetabulum debris and placed a bent Hohmann medially and a Cobra retractor laterally.  I then began reaming from a size 42 reamer in 2 mm increments up to a size 48 reamer with all  reamers placed under direct visualization and the last reamer placed under direct fluoroscopy, so we could obtain my depth of reaming, my inclination and anteversion.  I then chose a size 48 DePuy sector Gription acetabular component and we placed this into the acetabulum and knocked it into place, verified its placement under direct fluoroscopy as well.  I then placed the apex hole eliminator guide and a single screw.  I then placed a 32+ 4 neutral polyethylene liner.  Attention was then turned to the femur.  With all traction off the leg, the leg was externally rotated to 90 degrees, extended and adducted.  A Mueller retractor was placed medially and  a bent Hohmann behind the greater trochanter.  I released the lateral capsule and the piriformis.  I then used a box cutting guide to enter the femoral canal.  I used a rongeur to lateralize and then began broaching from a size 8 broach up to a size 11.  The 11 was felt to be stable, so I trialed varus offset neck and a 32+ 1 hip ball.  We brought the leg back over and up and with traction internal rotation reduced the hip.  She was stable with internal and external rotation.  Her leg lengths were measured equal under direct fluoroscopy.  We then dislocated the hip and removed the trial components.  I placed the real Corail femoral component size 11 (KLA).  I then placed the real ceramic 32+ 1 hip ball.  We reduced this back in the acetabulum and it was stable.  We copiously irrigated the soft tissues with normal saline solution.  I was able to close the joint capsule with interrupted #1 Ethibond suture followed by 0 V-Loc in the tensor fascia, 2-0 Vicryl in the deep and subcutaneous tissue and staples on the skin.  Well-padded sterile dressing was applied.  She was taken off the [facility] table.  Her leg lengths were again measured equal.  She was awakened, extubated, and taken to recovery room in stable condition.  All final counts were correct.  There were no complications noted.     Vanita Panda. Magnus Ivan, M.D.     CYB/MEDQ  D:  06/19/2012  T:  06/19/2012  Job:  [id number]

## 2012-06-19 NOTE — Brief Op Note (Signed)
06/19/2012  9:18 AM  PATIENT:  Pamela Wheeler  61 y.o. female  PRE-OPERATIVE DIAGNOSIS:  Severe arthritis right hip  POST-OPERATIVE DIAGNOSIS:  Severe arthritis right hip  PROCEDURE:  Procedure(s) (LRB) with comments: TOTAL HIP ARTHROPLASTY ANTERIOR APPROACH (Right) - Right Total Hip Arthroplasty, Anterior Approach (C-Arm)  SURGEON:  Surgeon(s) and Role:    * Kathryne Hitch, MD - Primary  PHYSICIAN ASSISTANT:   ASSISTANTS: none   ANESTHESIA:   general  EBL:  Total I/O In: 1000 [I.V.:1000] Out: 425 [Urine:150; Blood:275]  BLOOD ADMINISTERED:none  DRAINS: none   LOCAL MEDICATIONS USED:  NONE  SPECIMEN:  No Specimen  DISPOSITION OF SPECIMEN:  N/A  COUNTS:  YES  TOURNIQUET:  * No tourniquets in log *  DICTATION: .Other Dictation: Dictation Number [id number]  PLAN OF CARE: Admit to inpatient   PATIENT DISPOSITION:  PACU - hemodynamically stable.   Delay start of Pharmacological VTE agent (>24hrs) due to surgical blood loss or risk of bleeding: no

## 2012-06-19 NOTE — Anesthesia Preprocedure Evaluation (Signed)
Anesthesia Evaluation  Patient identified by MRN, date of birth, ID band Patient awake    Reviewed: Allergy & Precautions, H&P , NPO status , Patient's Chart, lab work & pertinent test results  History of Anesthesia Complications (+) PONV  Airway Mallampati: II TM Distance: <3 FB Neck ROM: Full    Dental No notable dental hx.    Pulmonary neg pulmonary ROS,  breath sounds clear to auscultation  Pulmonary exam normal       Cardiovascular hypertension, Pt. on medications Rhythm:Regular Rate:Normal     Neuro/Psych negative neurological ROS  negative psych ROS   GI/Hepatic negative GI ROS, Neg liver ROS,   Endo/Other  negative endocrine ROS  Renal/GU negative Renal ROS  negative genitourinary   Musculoskeletal negative musculoskeletal ROS (+)   Abdominal   Peds negative pediatric ROS (+)  Hematology negative hematology ROS (+)   Anesthesia Other Findings   Reproductive/Obstetrics negative OB ROS                           Anesthesia Physical Anesthesia Plan  ASA: II  Anesthesia Plan: General   Post-op Pain Management:    Induction: Intravenous  Airway Management Planned: Oral ETT  Additional Equipment:   Intra-op Plan:   Post-operative Plan: Extubation in OR  Informed Consent: I have reviewed the patients History and Physical, chart, labs and discussed the procedure including the risks, benefits and alternatives for the proposed anesthesia with the patient or authorized representative who has indicated his/her understanding and acceptance.   Dental advisory given  Plan Discussed with: CRNA and Surgeon  Anesthesia Plan Comments:         Anesthesia Quick Evaluation

## 2012-06-20 LAB — BASIC METABOLIC PANEL
BUN: 15 mg/dL (ref 6–23)
CO2: 27 mEq/L (ref 19–32)
Chloride: 105 mEq/L (ref 96–112)
GFR calc non Af Amer: >90 mL/min (ref >90)
Glucose, Bld: 140 mg/dL — ABNORMAL HIGH (ref 70–99)
Potassium: 4.1 mEq/L (ref 3.5–5.1)
Sodium: 136 mEq/L (ref 135–145)

## 2012-06-20 LAB — CBC
HCT: 29.5 % — ABNORMAL LOW (ref 36.0–46.0)
Hemoglobin: 9.7 g/dL — ABNORMAL LOW (ref 12.0–15.0)
MCHC: 32.9 g/dL (ref 30.0–36.0)
RBC: 3.27 MIL/uL — ABNORMAL LOW (ref 3.87–5.11)
WBC: 8.8 10*3/uL (ref 4.0–10.5)

## 2012-06-20 MED ORDER — METHOCARBAMOL 500 MG PO TABS: 500.0000 mg | ORAL_TABLET | Freq: Four times a day (QID) | ORAL | Status: DC | PRN

## 2012-06-20 MED ORDER — ASPIRIN 325 MG PO TBEC: 325.0000 mg | DELAYED_RELEASE_TABLET | Freq: Two times a day (BID) | ORAL | Status: DC

## 2012-06-20 MED ORDER — OXYCODONE-ACETAMINOPHEN 5-325 MG PO TABS
1.0000 | ORAL_TABLET | ORAL | Status: AC | PRN
Start: 2012-06-20 — End: 2012-06-30

## 2012-06-20 NOTE — Evaluation (Signed)
Occupational Therapy Evaluation Patient Details Name: Pamela Wheeler MRN: [id number] DOB: 1951/02/12 Today's Date: 06/20/2012 Time: 4098-1191 OT Time Calculation (min): 31 min  OT Assessment / Plan / Recommendation Clinical Impression  Pt presents to OT with decreased I with ADL activity s/p THR. Pt will benefit from skilled OT to increase I with ADL activity and return to PLOF    OT Assessment  Patient needs continued OT Services    Follow Up Recommendations  Home health OT       Equipment Recommendations  None recommended by OT       Frequency  Min 2X/week    Precautions / Restrictions Precautions Precautions: Fall Restrictions Weight Bearing Restrictions: No Other Position/Activity Restrictions: WBAT       ADL  Grooming: Simulated;Wash/dry face;Minimal assistance Where Assessed - Grooming: Unsupported standing Lower Body Bathing: Simulated;Moderate assistance (needs AE) Where Assessed - Lower Body Bathing: Unsupported sit to stand Upper Body Dressing: Performed;Set up Where Assessed - Upper Body Dressing: Unsupported sitting Lower Body Dressing: Performed;Moderate assistance Where Assessed - Lower Body Dressing: Unsupported sit to stand Toilet Transfer: Performed;Minimal assistance Toilet Transfer Method: Sit to stand Toilet Transfer Equipment: Comfort height toilet;Grab bars Toileting - Clothing Manipulation and Hygiene: Performed;Minimal assistance Where Assessed - Glass blower/designer Manipulation and Hygiene: Standing    OT Diagnosis: Generalized weakness  OT Problem List: Decreased strength OT Treatment Interventions: Self-care/ADL training;DME and/or AE instruction;Patient/family education   OT Goals Acute Rehab OT Goals OT Goal Formulation: With patient Potential to Achieve Goals: Good ADL Goals Pt Will Perform Grooming: with modified independence;Standing at sink ADL Goal: Grooming - Progress: Goal set today Pt Will Perform Lower Body Bathing:  with modified independence;Sit to stand from bed;with adaptive equipment ADL Goal: Lower Body Bathing - Progress: Goal set today Pt Will Perform Lower Body Dressing: with modified independence ADL Goal: Lower Body Dressing - Progress: Goal set today Pt Will Transfer to Toilet: with modified independence;Comfort height toilet ADL Goal: Toilet Transfer - Progress: Goal set today Pt Will Perform Toileting - Clothing Manipulation: with modified independence ADL Goal: Toileting - Clothing Manipulation - Progress: Goal set today Pt Will Perform Toileting - Hygiene: with modified independence ADL Goal: Toileting - Hygiene - Progress: Goal set today  Visit Information  Last OT Received On: 06/20/12 Assistance Needed: +1    Subjective Data  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   Prior Functioning     Home Living Lives With: Spouse Available Help at Discharge: Family Type of Home: House Home Access: Stairs to enter Secretary/administrator of Steps: 2 Entrance Stairs-Rails: None Home Layout: One level Bathroom Shower/Tub: Health visitor: Standard Home Adaptive Equipment: Walker - rolling;Bedside commode/3-in-1 Prior Function Level of Independence: Independent Able to Take Stairs?: Yes Driving: Yes Communication Communication: No difficulties Dominant Hand: Right            Cognition  Overall Cognitive Status: Appears within functional limits for tasks assessed/performed Arousal/Alertness: Awake/alert Orientation Level: Appears intact for tasks assessed Behavior During Session: [facility] for tasks performed    Extremity/Trunk Assessment Right Upper Extremity Assessment RUE ROM/Strength/Tone: [facility] for tasks assessed Left Upper Extremity Assessment LUE ROM/Strength/Tone: WFL for tasks assessed     Mobility Bed Mobility Bed Mobility: Supine to Sit Supine to Sit: 4: Min assist Details for Bed Mobility Assistance: Assist for RLE out of  bed with cues for hand placement on bed instead of rails to better simulate home environment.  Transfers Transfers:  Sit to Stand;Stand to Sit Sit to Stand: 4: Min guard;From elevated surface;With upper extremity assist;From bed Stand to Sit: 4: Min guard;With upper extremity assist;With armrests;To chair/3-in-1 Details for Transfer Assistance: Min/guard for safety with cues for hand placement and safety when sitting/standing.         Exercise Total Joint Exercises Ankle Circles/Pumps: AROM;Both;20 reps Quad Sets: AROM;Right;10 reps Heel Slides: AAROM;Right;10 reps Hip ABduction/ADduction: AAROM;Right;10 reps Straight Leg Raises: AAROM;Right;10 reps      End of Session OT - End of Session Activity Tolerance: Patient tolerated treatment well Patient left: in bed  GO     Emily Forse, Metro Kung 06/20/2012, 10:36 AM

## 2012-06-20 NOTE — Progress Notes (Signed)
Physical Therapy Treatment Patient Details Name: Pamela Wheeler MRN: [id number] DOB: 04-09-1951 Today's Date: 06/20/2012 Time: 7829-5621 PT Time Calculation (min): 19 min  PT Assessment / Plan / Recommendation Comments on Treatment Session  Pt continues to progress very well and did well with stair training this pm.  May practice again in am before D/C.     Follow Up Recommendations  Home health PT     Does the patient have the potential to tolerate intense rehabilitation     Barriers to Discharge        Equipment Recommendations  None recommended by PT    Recommendations for Other Services    Frequency 7X/week   Plan Discharge plan remains appropriate    Precautions / Restrictions Precautions Precautions: Fall Restrictions Weight Bearing Restrictions: No Other Position/Activity Restrictions: WBAT   Pertinent Vitals/Pain 4/10 pain, ice pack applied    Mobility  Bed Mobility Bed Mobility: Supine to Sit;Sit to Supine Supine to Sit: 4: Min assist Sit to Supine: 4: Min assist Details for Bed Mobility Assistance: Assist for RLE into and out of bed with cues for hand placement and technique.  Pt continues to reach for handrails with cues for placement on bed.  Transfers Transfers: Stand to Sit;Sit to Stand Sit to Stand: 5: Supervision;With upper extremity assist;From bed Stand to Sit: 5: Supervision;With upper extremity assist;To bed Details for Transfer Assistance: Supervision for safety with min cues for hand placement  Ambulation/Gait Ambulation/Gait Assistance: 5: Supervision Ambulation Distance (Feet): 200 Feet Assistive device: Rolling walker Ambulation/Gait Assistance Details: Min cues for upright posture and keeping RW closer to her.  Gait Pattern: Step-to pattern Gait velocity: decreased Stairs: Yes Stairs Assistance: 4: Min guard Stairs Assistance Details (indicate cue type and reason): Cues for sequencing/technique with RW going up backwards with RW.   Stair Management Technique: No rails;Step to pattern;Backwards;Forwards;With walker Number of Stairs: 2     Exercises     PT Diagnosis:    PT Problem List:   PT Treatment Interventions:     PT Goals Acute Rehab PT Goals PT Goal Formulation: With patient Time For Goal Achievement: 06/23/12 Potential to Achieve Goals: Good Pt will go Supine/Side to Sit: with supervision PT Goal: Supine/Side to Sit - Progress: Progressing toward goal Pt will go Sit to Supine/Side: with supervision PT Goal: Sit to Supine/Side - Progress: Progressing toward goal Pt will go Sit to Stand: with supervision PT Goal: Sit to Stand - Progress: Met Pt will go Stand to Sit: with supervision PT Goal: Stand to Sit - Progress: Met Pt will Ambulate: >150 feet;with supervision;with rolling walker PT Goal: Ambulate - Progress: Met Pt will Go Up / Down Stairs: 1-2 stairs;with min assist;with least restrictive assistive device PT Goal: Up/Down Stairs - Progress: Progressing toward goal  Visit Information  Last PT Received On: 06/20/12 Assistance Needed: +1    Subjective Data  Subjective: I got the catheter out.  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

## 2012-06-20 NOTE — Progress Notes (Signed)
Subjective: 1 Day Post-Op Procedure(s) (LRB): TOTAL HIP ARTHROPLASTY ANTERIOR APPROACH (Right) Patient reports pain as mild.  Did well with therapy.  Asymptomatic acute blood loss anemia.  Objective: Vital signs in last 24 hours: Temp:  [97.9 F (36.6 C)-98.6 F (37 C)] 98.2 F (36.8 C) (12/21 0623) Pulse Rate:  [78-99] 79  (12/21 0729) Resp:  [14-17] 17  (12/21 0729) BP: (91-119)/(49-73) 94/57 mmHg (12/21 0729) SpO2:  [95 %-100 %] 100 % (12/21 0729) Weight:  [67.132 kg (148 lb)] 67.132 kg (148 lb) (12/20 1056)  Intake/Output from previous day: 12/20 0701 - 12/21 0700 In: 3942.1 [P.O.:240; I.V.:3547.1; IV Piggyback:155] Out: 1475 [Urine:1200; Blood:275] Intake/Output this shift:     Basename 06/20/12 0430  HGB 9.7*    Basename 06/20/12 0430  WBC 8.8  RBC 3.27*  HCT 29.5*  PLT 189    Basename 06/20/12 0430  NA 136  K 4.1  CL 105  CO2 27  BUN 15  CREATININE 0.65  GLUCOSE 140*  CALCIUM 8.0*   No results found for this basename: LABPT:2,INR:2 in the last 72 hours  Sensation intact distally Intact pulses distally Dorsiflexion/Plantar flexion intact Incision: scant drainage  Assessment/Plan: 1 Day Post-Op Procedure(s) (LRB): TOTAL HIP ARTHROPLASTY ANTERIOR APPROACH (Right) Up with therapy Plan for discharge tomorrow  Kathryne Hitch 06/20/2012, 10:39 AM

## 2012-06-20 NOTE — Progress Notes (Addendum)
Physical Therapy Treatment Patient Details Name: Pamela Wheeler MRN: [id number] DOB: [birth year] Today's Date: 06/20/2012 Time: 2956-2130 PT Time Calculation (min): 26 min  PT Assessment / Plan / Recommendation Comments on Treatment Session  Pt progressing very well with little increase in pain with mobility.  Pt states 2/10 pain with ambulation.  Discussed concerns for keeping foley catheter in during session.  Pt states that she does not want to have it removed. Will practice stairs in pm for possible D/C today vs. Tomorrow.    Follow Up Recommendations  Home health PT     Does the patient have the potential to tolerate intense rehabilitation     Barriers to Discharge        Equipment Recommendations  None recommended by PT    Recommendations for Other Services OT consult  Frequency 7X/week   Plan Discharge plan remains appropriate    Precautions / Restrictions Precautions Precautions: Fall Restrictions Weight Bearing Restrictions: No Other Position/Activity Restrictions: WBAT   Pertinent Vitals/Pain 2/10, ice pack applied    Mobility  Bed Mobility Bed Mobility: Supine to Sit Supine to Sit: 4: Min assist Details for Bed Mobility Assistance: Assist for RLE out of bed with cues for hand placement on bed instead of rails to better simulate home environment.  Transfers Transfers: Stand to Sit;Sit to Stand Sit to Stand: 4: Min guard;From elevated surface;With upper extremity assist;From bed Stand to Sit: 4: Min guard;With upper extremity assist;With armrests;To chair/3-in-1 Details for Transfer Assistance: Min/guard for safety with cues for hand placement and safety when sitting/standing.  Ambulation/Gait Ambulation/Gait Assistance: 4: Min guard Ambulation Distance (Feet): 200 Feet Assistive device: Rolling walker Ambulation/Gait Assistance Details: Min cues for sequencing/technique with step to pattern.  Pt able to switch over to step through gait pattern as  ambulation continued.  Gait Pattern: Step-to pattern Gait velocity: decreased    Exercises Total Joint Exercises Ankle Circles/Pumps: AROM;Both;20 reps Quad Sets: AROM;Right;10 reps Heel Slides: AAROM;Right;10 reps Hip ABduction/ADduction: AAROM;Right;10 reps Straight Leg Raises: AAROM;Right;10 reps   PT Diagnosis:    PT Problem List:   PT Treatment Interventions:     PT Goals Acute Rehab PT Goals PT Goal Formulation: With patient Time For Goal Achievement: 06/23/12 Potential to Achieve Goals: Good Pt will go Supine/Side to Sit: with supervision PT Goal: Supine/Side to Sit - Progress: Progressing toward goal Pt will go Sit to Stand: with supervision PT Goal: Sit to Stand - Progress: Progressing toward goal Pt will go Stand to Sit: with supervision PT Goal: Stand to Sit - Progress: Progressing toward goal Pt will Ambulate: >150 feet;with supervision;with rolling walker PT Goal: Ambulate - Progress: Progressing toward goal  Visit Information  Last PT Received On: 06/20/12 Assistance Needed: +1    Subjective Data  Subjective: I don't want the catheter out.   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

## 2012-06-21 LAB — CBC
HCT: 27.5 % — ABNORMAL LOW (ref 36.0–46.0)
Hemoglobin: 9.1 g/dL — ABNORMAL LOW (ref 12.0–15.0)
MCH: 29.8 pg (ref 26.0–34.0)
RBC: 3.05 MIL/uL — ABNORMAL LOW (ref 3.87–5.11)

## 2012-06-21 NOTE — Progress Notes (Signed)
Pt to d[facility] with [facility] per Avie Arenas, Case Management. AVS reviewed and "My Chart" discussed with pt. Pt capable of verbalizing medications and follow-up appointments. Remains hemodynamically stable. No signs and symptoms of distress. Educated pt to return to ER in the case of SOB, dizziness, or chest pain.

## 2012-06-21 NOTE — Progress Notes (Signed)
Occupational Therapy Treatment Patient Details Name: Pamela Wheeler MRN: [id number] DOB: Oct 12, 1950 Today's Date: 06/21/2012 Time: 4098-1191 OT Time Calculation (min): 27 min  OT Assessment / Plan / Recommendation Comments on Treatment Session Pt doing well.          Equipment Recommendations  None recommended by OT          Plan Discharge plan remains appropriate           ADL  Upper Body Dressing: Performed;Set up Where Assessed - Upper Body Dressing: Unsupported sit to stand Lower Body Dressing: Performed;Min guard Where Assessed - Lower Body Dressing: Unsupported sit to stand Toilet Transfer: Performed;Supervision/safety Toilet Transfer Method: Sit to stand Toileting - Clothing Manipulation and Hygiene: Performed;Supervision/safety Where Assessed - Toileting Clothing Manipulation and Hygiene: Standing ADL Comments: Pt able to get dressed with OT this morning.  Verbasl cues needed for donning operative leg first, as well as safety with sit to stand and stand to sit       OT Goals ADL Goals ADL Goal: Lower Body Dressing - Progress: Progressing toward goals ADL Goal: Toilet Transfer - Progress: Progressing toward goals ADL Goal: Toileting - Clothing Manipulation - Progress: Progressing toward goals ADL Goal: Toileting - Hygiene - Progress: Progressing toward goals  Visit Information  Last OT Received On: 06/21/12          Cognition  Overall Cognitive Status: Appears within functional limits for tasks assessed/performed Arousal/Alertness: Awake/alert Orientation Level: Appears intact for tasks assessed Behavior During Session: [facility] for tasks performed    Mobility  Shoulder Instructions Bed Mobility Bed Mobility: Supine to Sit Supine to Sit: 5: Supervision;With rails Transfers Transfers: Sit to Stand;Stand to Sit Sit to Stand: 5: Supervision;With upper extremity assist Stand to Sit: 5: Supervision;With upper extremity assist Details for Transfer  Assistance: verbal cue not to pull up on walker             End of Session OT - End of Session Activity Tolerance: Patient tolerated treatment well Patient left: Other (comment) (with PT)       Korrin Waterfield, [place] Kung 06/21/2012, 8:24 AM

## 2012-06-21 NOTE — Progress Notes (Signed)
Physical Therapy Treatment Patient Details Name: Pamela Wheeler MRN: [id number] DOB: 11/30/1950 Today's Date: 06/21/2012 Time: 4098-1191 PT Time Calculation (min): 40 min (only charged 2 units due to pt was doing dressing activity with OT)  PT Assessment / Plan / Recommendation Comments on Treatment Session  Pt doing well with ambulation and exercises and is ready for D/C.     Follow Up Recommendations  Home health PT     Does the patient have the potential to tolerate intense rehabilitation     Barriers to Discharge        Equipment Recommendations  None recommended by PT    Recommendations for Other Services    Frequency 7X/week   Plan Discharge plan remains appropriate    Precautions / Restrictions Precautions Precautions: Fall Restrictions Weight Bearing Restrictions: No Other Position/Activity Restrictions: WBAT   Pertinent Vitals/Pain 610    Mobility  Bed Mobility Bed Mobility: Supine to Sit Supine to Sit: 5: Supervision;With rails Details for Bed Mobility Assistance: Cues for hand placement/technique using bed instead of rails.  Transfers Transfers: Stand to Sit;Sit to Stand Sit to Stand: 5: Supervision;With upper extremity assist Stand to Sit: 5: Supervision;With upper extremity assist Details for Transfer Assistance: Min cues for hand placement and not to pull on RW when standing.  Ambulation/Gait Ambulation/Gait Assistance: 5: Supervision Ambulation Distance (Feet): 200 Feet Assistive device: Rolling walker Ambulation/Gait Assistance Details: Min cues for step to vs step through when in increased pain and demonstrating antalgic gait pattern.  Gait Pattern: Step-to pattern Gait velocity: decreased Stairs: Yes Stairs Assistance: 5: Supervision Stairs Assistance Details (indicate cue type and reason): min cues for sequencing/technique with RW.  Stair Management Technique: No rails;Step to pattern;Backwards;Forwards;With walker Number of Stairs: 2      Exercises Total Joint Exercises Ankle Circles/Pumps: AROM;Both;10 reps Quad Sets: AROM;Right;10 reps Heel Slides: AAROM;Right;5 reps Hip ABduction/ADduction: AAROM;Right;5 reps   PT Diagnosis:    PT Problem List:   PT Treatment Interventions:     PT Goals Acute Rehab PT Goals PT Goal Formulation: With patient Time For Goal Achievement: 06/23/12 Potential to Achieve Goals: Good Pt will go Supine/Side to Sit: with supervision PT Goal: Supine/Side to Sit - Progress: Met Pt will go Sit to Stand: with supervision PT Goal: Sit to Stand - Progress: Met Pt will go Stand to Sit: with supervision PT Goal: Stand to Sit - Progress: Met Pt will Ambulate: >150 feet;with supervision;with rolling walker PT Goal: Ambulate - Progress: Met Pt will Go Up / Down Stairs: 1-2 stairs;with min assist;with least restrictive assistive device PT Goal: Up/Down Stairs - Progress: Met  Visit Information  Last PT Received On: 06/21/12 Assistance Needed: +1    Subjective Data  Subjective: Are yall ready to kick me out.  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

## 2012-06-21 NOTE — Progress Notes (Addendum)
   CARE MANAGEMENT NOTE 06/21/2012  Patient:  Wheeler,Pamela L   Account Number:  [id number]  Date Initiated:  06/21/2012  Documentation initiated by:  [facility]  Subjective/Objective Assessment:     Action/Plan:   Anticipated DC Date:  06/21/2012   Anticipated DC Plan:  HOME W HOME HEALTH SERVICES      DC Planning Services  CM consult      [facility] Choice  HOME HEALTH   Choice offered to / List presented to:  C-1 Patient        HH arranged  HH-2 PT      [facility] agency  [facility]   Status of service:  Completed, signed off Medicare Important Message given?   (If response is "NO", the following Medicare IM given date fields will be blank) Date Medicare IM given:   Date Additional Medicare IM given:    Discharge Disposition:  HOME W HOME HEALTH SERVICES  Per UR Regulation:    If discussed at Long Length of Stay Meetings, dates discussed:    Comments:  06/21/2012 1420 ( late entry for 1000) NCM spoke to pt and requested [facility] for [facility]. Faxed facesheet and orders to [facility] for [facility] for scheduled d/c home today.  Provided pt with [facility] contact info.    Isidoro Donning RN CCM Case Mgmt phone [phone]

## 2012-06-21 NOTE — Discharge Summary (Signed)
Physician Discharge Summary  Patient ID: Pamela Wheeler MRN: [id number] DOB/AGE: 1950/10/19 61 y.o.  Admit date: 06/19/2012 Discharge date: 06/21/2012  Admission Diagnoses: Osteoarthritis right hip  Discharge Diagnoses: Same Principal Problem:  *Degenerative arthritis of hip   Discharged Condition: stable  Hospital Course: Patient's hospital course was essentially unremarkable. She was admitted for total hip arthroplasty on the right. She progressed well with therapy and was discharged to home in stable condition.  Consults: None  Significant Diagnostic Studies: labs: Routine labs  Treatments: surgery: See operative note  Discharge Exam: Blood pressure 125/75, pulse 87, temperature 100.2 F (37.9 C), temperature source Oral, resp. rate 16, height 5\' 3"  (1.6 m), weight 67.132 kg (148 lb), SpO2 93.00%. Incision/Wound: incision clean and dry.  Disposition: Final discharge disposition not confirmed  Discharge Orders    Future Orders Please Complete By Expires   Diet - low sodium heart healthy      Diet - low sodium heart healthy      Call MD / Call 911      Comments:   If you experience chest pain or shortness of breath, CALL 911 and be transported to the hospital emergency room.  If you develope a fever above 101 F, pus (white drainage) or increased drainage or redness at the wound, or calf pain, call your surgeon's office.   Constipation Prevention      Comments:   Drink plenty of fluids.  Prune juice may be helpful.  You may use a stool softener, such as Colace (over the counter) 100 mg twice a day.  Use MiraLax (over the counter) for constipation as needed.   Increase activity slowly as tolerated      Discharge instructions      Comments:   Increase your activities as comfort allows. Expect right thigh and leg swelling; ice as needed. Wait until 12/25 to get your incision wet in the shower, then daily dry dressing.   Call MD / Call 911      Comments:   If you  experience chest pain or shortness of breath, CALL 911 and be transported to the hospital emergency room.  If you develope a fever above 101 F, pus (white drainage) or increased drainage or redness at the wound, or calf pain, call your surgeon's office.   Constipation Prevention      Comments:   Drink plenty of fluids.  Prune juice may be helpful.  You may use a stool softener, such as Colace (over the counter) 100 mg twice a day.  Use MiraLax (over the counter) for constipation as needed.   Increase activity slowly as tolerated          Medication List     As of 06/21/2012  8:27 AM    STOP taking these medications         diclofenac 75 MG EC tablet   Commonly known as: VOLTAREN      TAKE these medications         amLODipine 5 MG tablet   Commonly known as: NORVASC   Take 5 mg by mouth daily before breakfast.      aspirin 325 MG EC tablet   Take 1 tablet (325 mg total) by mouth 2 (two) times daily.      atorvastatin 40 MG tablet   Commonly known as: LIPITOR   Take 40 mg by mouth every evening.      CALTRATE 600+D PO   Take 1 tablet by mouth 2 (two) times  daily.      CENTRUM SILVER PO   Take 1 tablet by mouth daily.      CO Q 10 PO   Take 1 tablet by mouth every evening.      divalproex 500 MG 24 hr tablet   Commonly known as: DEPAKOTE ER   Take 500 mg by mouth daily.      escitalopram 20 MG tablet   Commonly known as: LEXAPRO   Take 20 mg by mouth every evening.      methocarbamol 500 MG tablet   Commonly known as: ROBAXIN   Take 1 tablet (500 mg total) by mouth every 6 (six) hours as needed.      oxyCODONE-acetaminophen 5-325 MG per tablet   Commonly known as: PERCOCET/ROXICET   Take 1-2 tablets by mouth every 4 (four) hours as needed for pain.      traMADol 50 MG tablet   Commonly known as: ULTRAM   Take 50 mg by mouth daily.           Follow-up Information    Follow up with Kathryne Hitch, MD.   Contact information:   [address]  [address] [place] [address] [phone]          Signed: Nadara Mustard 06/21/2012, 8:27 AM

## 2012-06-22 ENCOUNTER — Encounter (HOSPITAL_COMMUNITY): Payer: Self-pay | Admitting: Orthopaedic Surgery

## 2012-06-23 NOTE — Progress Notes (Signed)
Discharge summary sent to payer through [facility]  

## 2012-12-31 ENCOUNTER — Other Ambulatory Visit: Payer: Self-pay

## 2012-12-31 DIAGNOSIS — Z803 Family history of malignant neoplasm of breast: Secondary | ICD-10-CM

## 2012-12-31 DIAGNOSIS — Z1231 Encounter for screening mammogram for malignant neoplasm of breast: Secondary | ICD-10-CM

## 2013-02-04 ENCOUNTER — Ambulatory Visit
Admission: RE | Admit: 2013-02-04 | Discharge: 2013-02-04 | Disposition: A | Discharge status: Home / Self Care | Payer: BC Managed Care – PPO | Source: Ambulatory Visit

## 2013-02-04 DIAGNOSIS — Z1231 Encounter for screening mammogram for malignant neoplasm of breast: Secondary | ICD-10-CM

## 2013-02-04 DIAGNOSIS — Z803 Family history of malignant neoplasm of breast: Secondary | ICD-10-CM

## 2014-02-18 ENCOUNTER — Other Ambulatory Visit: Payer: Self-pay

## 2014-02-18 DIAGNOSIS — Z1231 Encounter for screening mammogram for malignant neoplasm of breast: Secondary | ICD-10-CM

## 2014-03-14 ENCOUNTER — Encounter (INDEPENDENT_AMBULATORY_CARE_PROVIDER_SITE_OTHER): Payer: Self-pay

## 2014-03-14 ENCOUNTER — Ambulatory Visit
Admission: RE | Admit: 2014-03-14 | Discharge: 2014-03-14 | Disposition: A | Discharge status: Home / Self Care | Payer: 59 | Source: Ambulatory Visit

## 2014-03-14 DIAGNOSIS — Z1231 Encounter for screening mammogram for malignant neoplasm of breast: Secondary | ICD-10-CM

## 2015-02-15 ENCOUNTER — Other Ambulatory Visit: Payer: Self-pay

## 2015-02-15 DIAGNOSIS — Z1231 Encounter for screening mammogram for malignant neoplasm of breast: Secondary | ICD-10-CM

## 2015-03-28 ENCOUNTER — Ambulatory Visit
Admission: RE | Admit: 2015-03-28 | Discharge: 2015-03-28 | Disposition: A | Discharge status: Home / Self Care | Payer: Medicare Other | Source: Ambulatory Visit

## 2015-03-28 DIAGNOSIS — Z1231 Encounter for screening mammogram for malignant neoplasm of breast: Secondary | ICD-10-CM

## 2015-07-25 DIAGNOSIS — M8580 Other specified disorders of bone density and structure, unspecified site: Secondary | ICD-10-CM | POA: Insufficient documentation

## 2015-07-25 DIAGNOSIS — R7303 Prediabetes: Secondary | ICD-10-CM | POA: Insufficient documentation

## 2015-07-25 DIAGNOSIS — M5137 Other intervertebral disc degeneration, lumbosacral region: Secondary | ICD-10-CM | POA: Insufficient documentation

## 2015-07-25 DIAGNOSIS — I10 Essential (primary) hypertension: Secondary | ICD-10-CM | POA: Insufficient documentation

## 2015-07-25 DIAGNOSIS — F429 Obsessive-compulsive disorder, unspecified: Secondary | ICD-10-CM | POA: Insufficient documentation

## 2015-07-25 DIAGNOSIS — M1990 Unspecified osteoarthritis, unspecified site: Secondary | ICD-10-CM | POA: Insufficient documentation

## 2015-07-25 DIAGNOSIS — G4730 Sleep apnea, unspecified: Secondary | ICD-10-CM | POA: Insufficient documentation

## 2015-07-25 DIAGNOSIS — E785 Hyperlipidemia, unspecified: Secondary | ICD-10-CM | POA: Insufficient documentation

## 2015-09-04 DIAGNOSIS — E538 Deficiency of other specified B group vitamins: Secondary | ICD-10-CM | POA: Insufficient documentation

## 2016-02-29 ENCOUNTER — Other Ambulatory Visit: Payer: Self-pay | Admitting: Internal Medicine

## 2016-02-29 DIAGNOSIS — Z1231 Encounter for screening mammogram for malignant neoplasm of breast: Secondary | ICD-10-CM

## 2016-03-28 ENCOUNTER — Ambulatory Visit
Admission: RE | Admit: 2016-03-28 | Discharge: 2016-03-28 | Disposition: A | Discharge status: Home / Self Care | Payer: Medicare Other | Source: Ambulatory Visit | Attending: Internal Medicine | Admitting: Internal Medicine

## 2016-03-28 DIAGNOSIS — Z1231 Encounter for screening mammogram for malignant neoplasm of breast: Secondary | ICD-10-CM

## 2016-04-01 DIAGNOSIS — G8929 Other chronic pain: Secondary | ICD-10-CM | POA: Insufficient documentation

## 2016-10-11 DIAGNOSIS — Z780 Asymptomatic menopausal state: Secondary | ICD-10-CM | POA: Insufficient documentation

## 2016-10-11 DIAGNOSIS — H9313 Tinnitus, bilateral: Secondary | ICD-10-CM | POA: Insufficient documentation

## 2016-10-11 DIAGNOSIS — L918 Other hypertrophic disorders of the skin: Secondary | ICD-10-CM | POA: Insufficient documentation

## 2017-02-26 ENCOUNTER — Other Ambulatory Visit: Payer: Self-pay | Admitting: Internal Medicine

## 2017-02-26 DIAGNOSIS — Z1231 Encounter for screening mammogram for malignant neoplasm of breast: Secondary | ICD-10-CM

## 2017-03-31 ENCOUNTER — Ambulatory Visit
Admission: RE | Admit: 2017-03-31 | Discharge: 2017-03-31 | Disposition: A | Discharge status: Home / Self Care | Payer: Medicare Other | Source: Ambulatory Visit | Attending: Internal Medicine | Admitting: Internal Medicine

## 2017-03-31 DIAGNOSIS — Z1231 Encounter for screening mammogram for malignant neoplasm of breast: Secondary | ICD-10-CM

## 2017-11-12 DIAGNOSIS — L821 Other seborrheic keratosis: Secondary | ICD-10-CM | POA: Insufficient documentation

## 2017-11-12 DIAGNOSIS — K219 Gastro-esophageal reflux disease without esophagitis: Secondary | ICD-10-CM | POA: Insufficient documentation

## 2017-12-30 ENCOUNTER — Other Ambulatory Visit: Payer: Self-pay | Admitting: Internal Medicine

## 2017-12-30 DIAGNOSIS — R519 Headache, unspecified: Secondary | ICD-10-CM | POA: Insufficient documentation

## 2017-12-30 DIAGNOSIS — N644 Mastodynia: Secondary | ICD-10-CM

## 2018-01-06 ENCOUNTER — Other Ambulatory Visit: Payer: Medicare Other

## 2018-01-06 ENCOUNTER — Ambulatory Visit
Admission: RE | Admit: 2018-01-06 | Discharge: 2018-01-06 | Disposition: A | Discharge status: Home / Self Care | Payer: Medicare Other | Source: Ambulatory Visit | Attending: Internal Medicine | Admitting: Internal Medicine

## 2018-01-06 ENCOUNTER — Ambulatory Visit: Payer: Medicare Other

## 2018-01-06 DIAGNOSIS — N644 Mastodynia: Secondary | ICD-10-CM

## 2018-02-17 DIAGNOSIS — S61459A Open bite of unspecified hand, initial encounter: Secondary | ICD-10-CM | POA: Insufficient documentation

## 2018-02-17 DIAGNOSIS — W540XXA Bitten by dog, initial encounter: Secondary | ICD-10-CM | POA: Insufficient documentation

## 2018-05-25 ENCOUNTER — Other Ambulatory Visit: Payer: Self-pay | Admitting: Internal Medicine

## 2018-05-25 DIAGNOSIS — Z1231 Encounter for screening mammogram for malignant neoplasm of breast: Secondary | ICD-10-CM

## 2018-07-07 ENCOUNTER — Ambulatory Visit (INDEPENDENT_AMBULATORY_CARE_PROVIDER_SITE_OTHER): Payer: Medicare Other

## 2018-07-07 ENCOUNTER — Encounter (INDEPENDENT_AMBULATORY_CARE_PROVIDER_SITE_OTHER): Payer: Self-pay | Admitting: Orthopaedic Surgery

## 2018-07-07 ENCOUNTER — Other Ambulatory Visit (INDEPENDENT_AMBULATORY_CARE_PROVIDER_SITE_OTHER): Payer: Self-pay

## 2018-07-07 ENCOUNTER — Ambulatory Visit (INDEPENDENT_AMBULATORY_CARE_PROVIDER_SITE_OTHER): Payer: Medicare Other | Admitting: Orthopaedic Surgery

## 2018-07-07 ENCOUNTER — Ambulatory Visit (INDEPENDENT_AMBULATORY_CARE_PROVIDER_SITE_OTHER): Payer: Self-pay | Admitting: Orthopaedic Surgery

## 2018-07-07 DIAGNOSIS — M542 Cervicalgia: Secondary | ICD-10-CM

## 2018-07-07 DIAGNOSIS — M25552 Pain in left hip: Secondary | ICD-10-CM

## 2018-07-07 DIAGNOSIS — M25511 Pain in right shoulder: Secondary | ICD-10-CM | POA: Diagnosis not present

## 2018-07-07 DIAGNOSIS — G8929 Other chronic pain: Secondary | ICD-10-CM | POA: Diagnosis not present

## 2018-07-07 NOTE — Progress Notes (Signed)
Office Visit Note   Patient: Pamela Wheeler           Date of Birth: 14-Nov-1950           MRN: [id number] Visit Date: 07/07/2018              Requested by: Raina Mina., MD [place], [place] [address] PCP: Raina Mina., MD   Assessment & Plan: Visit Diagnoses:  1. Pain in left hip   2. Neck pain   3. Chronic right shoulder pain     Plan: I went over her x-rays with her in detail showing her significant arthritis in her right shoulder as well as her cervical spine.  However I do feel most of her issues are related to her shoulder.  I spoke with her in detail about treatment options.  She would like to try an intra-articular steroid injection in her right shoulder joint.  I think this is reasonable to try.  She may end up needing some physical therapy as well.  She understands of these treatment modalities fail we would send her to my partner Dr. Marlou Sa to consider shoulder replacement surgery.  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  We will set her up for an intra-articular steroid injection under fluoroscopy by Dr. Ernestina Patches in the near future.  All question concerns were answered and addressed.  I will see her back myself in about 4 weeks.  Follow-Up Instructions: Return in about 4 weeks (around 08/04/2018).   Orders:  Orders Placed This Encounter  Procedures  . XR Pelvis 1-2 Views  . XR Shoulder Right  . XR Cervical Spine 2 or 3 views   No orders of the defined types were placed in this encounter.     Procedures: No procedures performed   Clinical Data: No additional findings.   Subjective: Chief Complaint  Patient presents with  . Right Shoulder - Pain  The patient is a very pleasant 68 year old female who comes in for evaluation treatment of right shoulder pain.  We actually performed a right total hip arthroplasty under 6 years ago and she  has no issues with that hip at all.  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  She denies injury to that right shoulder.  She is actually left-hand dominant.  She has shoulder pain that radiates down to her elbow and into her neck.  She denies numbness and tingling in her hand has not been able to perform activities of daily living with lifting her arm above her head for some time now.  It is getting worse for her and there is pain significantly around the shoulder girdle itself.  She denies any issues with her hip replacement and denies any left hip pain.  She is a petite individual and is not a diabetic.  She denies any left upper extremity issues.  HPI  Review of Systems .  She denies any headache, chest pain, shortness of breath, fever, chills, nausea, vomiting.  Objective: Vital Signs: There were no vitals taken for this visit.  Physical Exam She is alert and orient x3 and in no acute distress Ortho Exam Examination of her right shoulder show significant deficits of the glenohumeral joint in terms of mobility of the passively her range of motion is pretty good but actively it is very painful and there is  grinding at the glenohumeral joint.  There is significant weakness of the rotator cuff.  Her elbow and hand exam are normal right side.  She has slightly diminished grip strength on the right comparing the right and left but she is left-hand-dominant.  Both her hips move smoothly with no issues at all. Specialty Comments:  No specialty comments available.  Imaging: Xr Cervical Spine 2 Or 3 Views  Result Date: 07/07/2018 2 views of the cervical spine showed multilevel degenerative disc disease with the space narrowing and osteophytes at several levels.  There is loss of cervical lordosis on the lateral view.  Xr Pelvis 1-2 Views  Result Date: 07/07/2018 An AP pelvis shows the top of the right total hip arthroplasty and there are no complicating features with  that part of the implant that is seen.  The left hip shows no significant arthritic changes.  Xr Shoulder Right  Result Date: 07/07/2018 3 views of the right shoulder show significant end-stage arthritis of the glenohumeral joint.  There is joint space narrowing as well as sclerotic and cystic changes in the femoral head.    PMFS History: Patient Active Problem List   Diagnosis Date Noted  . Degenerative arthritis of hip 06/19/2012   Past Medical History:  Diagnosis Date  . Anxiety    OCD  . Arthritis   . Bone spur of other site    spine,  l-5  . Hypertension   . PONV (postoperative nausea and vomiting)     Family History  Problem Relation Age of Onset  . Breast cancer Neg Hx     Past Surgical History:  Procedure Laterality Date  . CHOLECYSTECTOMY  2006  . TONSILLECTOMY    . TOTAL HIP ARTHROPLASTY  06/19/2012   Procedure: TOTAL HIP ARTHROPLASTY ANTERIOR APPROACH;  Surgeon: Mcarthur Rossetti, MD;  Location: [facility];  Service: Orthopedics;  Laterality: Right;  Right Total Hip Arthroplasty, Anterior Approach (C-Arm)   Social History   Occupational History  . Not on file  Tobacco Use  . Smoking status: Never Smoker  . Smokeless tobacco: Never Used  Substance and Sexual Activity  . Alcohol use: No  . Drug use: No  . Sexual activity: Not on file

## 2018-07-13 ENCOUNTER — Ambulatory Visit
Admission: RE | Admit: 2018-07-13 | Discharge: 2018-07-13 | Disposition: A | Discharge status: Home / Self Care | Payer: Medicare Other | Source: Ambulatory Visit | Attending: Internal Medicine | Admitting: Internal Medicine

## 2018-07-13 DIAGNOSIS — Z1231 Encounter for screening mammogram for malignant neoplasm of breast: Secondary | ICD-10-CM

## 2018-07-21 ENCOUNTER — Ambulatory Visit (INDEPENDENT_AMBULATORY_CARE_PROVIDER_SITE_OTHER): Payer: Medicare Other | Admitting: Physical Medicine and Rehabilitation

## 2018-07-21 ENCOUNTER — Ambulatory Visit (INDEPENDENT_AMBULATORY_CARE_PROVIDER_SITE_OTHER): Payer: Self-pay

## 2018-07-21 ENCOUNTER — Encounter (INDEPENDENT_AMBULATORY_CARE_PROVIDER_SITE_OTHER): Payer: Self-pay | Admitting: Physical Medicine and Rehabilitation

## 2018-07-21 DIAGNOSIS — M25511 Pain in right shoulder: Secondary | ICD-10-CM | POA: Diagnosis not present

## 2018-07-21 DIAGNOSIS — G8929 Other chronic pain: Secondary | ICD-10-CM | POA: Diagnosis not present

## 2018-07-21 NOTE — Patient Instructions (Signed)

## 2018-07-21 NOTE — Progress Notes (Signed)
.  Numeric Pain Rating Scale and Functional Assessment Average Pain 7   In the last MONTH (on 0-10 scale) has pain interfered with the following?  1. General activity like being  able to carry out your everyday physical activities such as walking, climbing stairs, carrying groceries, or moving a chair?  Rating(6)   -Dye Allergies.  

## 2018-07-21 NOTE — Progress Notes (Signed)
   Pamela Wheeler - 68 y.o. female MRN [id number]  Date of birth: November 30, 1950  Office Visit Note: Visit Date: 07/21/2018 PCP: Raina Mina., MD Referred by: Raina Mina., MD  Subjective: Chief Complaint  Patient presents with  . Right Shoulder - Pain  . Right Wrist - Pain  . Right Elbow - Pain   HPI:  Pamela Wheeler is a 68 y.o. female who comes in today For planned intra-articular right glenohumeral joint anesthetic arthrogram.  This is requested by Dr. Jean Rosenthal who is been following her for some time from an orthopedic standpoint.  She has a history of prior right total hip arthroplasty.  She is left-hand dominant with chronic worsening pain that refers from the right shoulder into the right elbow and sometimes as far down as the wrist.  No real history of tingling numbness.  X-rays obtained by Dr. Ninfa Linden show significant osteoarthritis of the glenohumeral joint on the right.  She also has some arthritic changes of the cervical spine.  Exam today shows significantly decreased range of motion of the right shoulder with painful movement.  She reports nothing really has helped her pain.  She did ask me today about any medications that would help and we suggested the possibility it may be something like a Celebrex anti-inflammatory.  She has been talked with Dr. Ninfa Linden about that.  ROS Otherwise per HPI.  Assessment & Plan: Visit Diagnoses:  1. Chronic right shoulder pain     Plan: Findings:  Diagnostic and hopefully therapeutic anesthetic arthrogram of the right glenohumeral joint.  Flow of contrast appeared to be intra-articular with decent arthrogram without extravasation.  Interestingly though medication was seemingly hard to deliver more than normal.  Joint is very tight.  Patient did not seem to have much relief during anesthetic phase.  In the future would consider repeat injection with possibility of getting better flow of contrast.    Meds & Orders:  No orders of the defined types were placed in this encounter.  No orders of the defined types were placed in this encounter.   Follow-up: No follow-ups on file.   Procedures: Large Joint Inj: R glenohumeral on 07/21/2018 3:15 PM Indications: pain and diagnostic evaluation Details: 22 G 3.5 in needle, anteromedial approach  Arthrogram: Yes  Medications: 3 mL bupivacaine 0.5 %; 60 mg triamcinolone acetonide 40 MG/ML  Flow of contrast appeared to be intra-articular with decent arthrogram without extravasation.  Interestingly though medication was seemingly hard to deliver more than normal.  Joint is very tight.  Patient did not seem to have much relief during anesthetic phase.  In the future would consider repeat injection with possibility of getting better flow of contrast.   Procedure, treatment alternatives, risks and benefits explained, specific risks discussed. Consent was given by the patient. Immediately prior to procedure a time out was called to verify the correct patient, procedure, equipment, support staff and site/side marked as required. Patient was prepped and draped in the usual sterile fashion.      No notes on file   Clinical History: No specialty comments available.     Objective:  VS:  HT:    WT:   BMI:     BP:   HR: bpm  TEMP: ( )  RESP:  Physical Exam  Ortho Exam Imaging: No results found.

## 2018-07-22 MED ORDER — TRIAMCINOLONE ACETONIDE 40 MG/ML IJ SUSP
60.0000 mg | INTRAMUSCULAR | Status: AC | PRN
  Administered 2018-07-21: 60 mg via INTRA_ARTICULAR

## 2018-07-22 MED ORDER — BUPIVACAINE HCL 0.5 % IJ SOLN
3.0000 mL | INTRAMUSCULAR | Status: AC | PRN
Start: 2018-07-21 — End: 2018-07-21
  Administered 2018-07-21: 3 mL via INTRA_ARTICULAR

## 2018-08-05 ENCOUNTER — Ambulatory Visit (INDEPENDENT_AMBULATORY_CARE_PROVIDER_SITE_OTHER): Payer: Medicare Other | Admitting: Orthopaedic Surgery

## 2018-08-05 ENCOUNTER — Encounter (INDEPENDENT_AMBULATORY_CARE_PROVIDER_SITE_OTHER): Payer: Self-pay | Admitting: Orthopaedic Surgery

## 2018-08-05 DIAGNOSIS — M25511 Pain in right shoulder: Secondary | ICD-10-CM | POA: Diagnosis not present

## 2018-08-05 DIAGNOSIS — G8929 Other chronic pain: Secondary | ICD-10-CM | POA: Diagnosis not present

## 2018-08-05 MED ORDER — CELECOXIB 200 MG PO CAPS: 200.0000 mg | ORAL_CAPSULE | Freq: Every day | ORAL | 0 refills | Status: DC

## 2018-08-05 NOTE — Progress Notes (Signed)
Office Visit Note   Patient: Pamela Wheeler           Date of Birth: 02-17-1951           MRN: [id number] Visit Date: 08/05/2018              Requested by: Raina Mina., MD [place], [place] [address] PCP: Raina Mina., MD   Assessment & Plan: Visit Diagnoses:  1. Chronic right shoulder pain     Plan: She does not want any type of surgical intervention at this point time she would rather try medication such as Celebrex as she is had some stomach upset with NSAIDs in the past.  Therefore we will place her on Celebrex 200 mg once daily.  She will have her yearly labs with her primary care physician let them know that she is actually on Celebrex.  She will follow-up with [place] on as-needed basis pain persist or becomes worse.  Not recommend repeat intra-articular injection of the right shoulder for 6 months.  Follow-Up Instructions: Return if symptoms worsen or fail to improve.   Orders:  No orders of the defined types were placed in this encounter.  Meds ordered this encounter  Medications  . celecoxib (CELEBREX) 200 MG capsule    Sig: Take 1 capsule (200 mg total) by mouth daily.    Dispense:  30 capsule    Refill:  0      Procedures: No procedures performed   Clinical Data: No additional findings.   Subjective: Chief Complaint  Patient presents with  . Right Shoulder - Follow-up    HPI Pamela Wheeler returns today follow-up of her right shoulder.  She has degenerative changes of the right shoulder which are severe sent to Dr. Ernestina Patches on 07/21/2018 and underwent an intra-articular injection she states that scale 0-10 that her pain has improved by factor 5.  She notes it has taken the "edge off". Denies any radicular symptoms down the right arm.   Review of Systems See HPI Objective: Vital Signs: There were no vitals taken for this visit.  Physical Exam Constitutional:      Appearance: She is not ill-appearing or diaphoretic.  Pulmonary:       Effort: Pulmonary effort is normal.  Neurological:     Mental Status: She is alert and oriented to person, place, and time.     Ortho Exam Decreased range of motion right shoulder. Forward flexion to only 90 degrees.  She has good range of motion of the right elbow forearm without pain. Specialty Comments:  No specialty comments available.  Imaging: No results found.   PMFS History: Patient Active Problem List   Diagnosis Date Noted  . Degenerative arthritis of hip 06/19/2012   Past Medical History:  Diagnosis Date  . Anxiety    OCD  . Arthritis   . Bone spur of other site    spine,  l-5  . Hypertension   . PONV (postoperative nausea and vomiting)     Family History  Problem Relation Age of Onset  . Breast cancer Mother        in 51's  . Breast cancer Sister 85  . Breast cancer Maternal Grandmother     Past Surgical History:  Procedure Laterality Date  . CHOLECYSTECTOMY  2006  . TONSILLECTOMY    . TOTAL HIP ARTHROPLASTY  06/19/2012   Procedure: TOTAL HIP ARTHROPLASTY ANTERIOR APPROACH;  Surgeon: Mcarthur Rossetti, MD;  Location: [facility];  Service: Orthopedics;  Laterality: Right;  Right Total Hip Arthroplasty, Anterior Approach (C-Arm)   Social History   Occupational History  . Not on file  Tobacco Use  . Smoking status: Never Smoker  . Smokeless tobacco: Never Used  Substance and Sexual Activity  . Alcohol use: No  . Drug use: No  . Sexual activity: Not on file

## 2018-08-29 ENCOUNTER — Other Ambulatory Visit (INDEPENDENT_AMBULATORY_CARE_PROVIDER_SITE_OTHER): Payer: Self-pay | Admitting: Physician Assistant

## 2018-08-31 NOTE — Telephone Encounter (Signed)
Please advise 

## 2019-07-11 ENCOUNTER — Ambulatory Visit: Payer: Medicare Other | Attending: Internal Medicine

## 2019-07-11 DIAGNOSIS — Z23 Encounter for immunization: Secondary | ICD-10-CM | POA: Insufficient documentation

## 2019-07-11 NOTE — Progress Notes (Signed)
   Covid-19 Vaccination Clinic  Name:  Pamela Wheeler    MRN: [id number] DOB: [birth year]  07/11/2019  [name] was observed post Covid-19 immunization for 15 minutes without incidence. She was provided with Vaccine Information Sheet and instruction to access the V-Safe system.   [name] was instructed to call 911 with any severe reactions post vaccine: Marland Kitchen Difficulty breathing  . Swelling of your face and throat  . A fast heartbeat  . A bad rash all over your body  . Dizziness and weakness    Immunizations Administered    Name Date Dose VIS Date Route   Pfizer COVID-19 Vaccine 07/11/2019 12:42 PM 0.3 mL 06/11/2019 Intramuscular   Manufacturer: [employer]   Lot: [id number]   [place]: [id number]

## 2019-07-21 ENCOUNTER — Other Ambulatory Visit: Payer: Self-pay | Admitting: Internal Medicine

## 2019-07-21 DIAGNOSIS — Z1231 Encounter for screening mammogram for malignant neoplasm of breast: Secondary | ICD-10-CM

## 2019-08-01 ENCOUNTER — Ambulatory Visit: Payer: Medicare PPO | Attending: Internal Medicine

## 2019-08-01 DIAGNOSIS — Z23 Encounter for immunization: Secondary | ICD-10-CM | POA: Insufficient documentation

## 2019-08-01 NOTE — Progress Notes (Signed)
   Covid-19 Vaccination Clinic  Name:  Pamela Wheeler    MRN: [id number] DOB: 06-05-1951  08/01/2019  Pamela Wheeler was observed post Covid-19 immunization for 30 minutes based on pre-vaccination screening without incidence. She was provided with Vaccine Information Sheet and instruction to access the V-Safe system.   Pamela Wheeler was instructed to call 911 with any severe reactions post vaccine: Marland Kitchen Difficulty breathing  . Swelling of your face and throat  . A fast heartbeat  . A bad rash all over your body  . Dizziness and weakness    Immunizations Administered    Name Date Dose VIS Date Route   Pfizer COVID-19 Vaccine 08/01/2019  9:50 AM 0.3 mL 06/11/2019 Intramuscular   Manufacturer: [place]   Lot: [id number]   [place]: [id number]

## 2019-08-27 ENCOUNTER — Ambulatory Visit
Admission: RE | Admit: 2019-08-27 | Discharge: 2019-08-27 | Disposition: A | Discharge status: Home / Self Care | Payer: Medicare Other | Source: Ambulatory Visit | Attending: Internal Medicine | Admitting: Internal Medicine

## 2019-08-27 ENCOUNTER — Other Ambulatory Visit: Payer: Self-pay

## 2019-08-27 DIAGNOSIS — Z1231 Encounter for screening mammogram for malignant neoplasm of breast: Secondary | ICD-10-CM

## 2019-11-23 DIAGNOSIS — F5101 Primary insomnia: Secondary | ICD-10-CM | POA: Insufficient documentation

## 2020-05-03 ENCOUNTER — Encounter: Payer: Self-pay | Admitting: Gastroenterology

## 2020-05-19 ENCOUNTER — Other Ambulatory Visit: Payer: Self-pay

## 2020-05-19 ENCOUNTER — Ambulatory Visit (AMBULATORY_SURGERY_CENTER): Payer: Self-pay | Admitting: *Deleted

## 2020-05-19 VITALS — Ht 63.0 in | Wt 142.0 lb

## 2020-05-19 DIAGNOSIS — Z86010 Personal history of colonic polyps: Secondary | ICD-10-CM

## 2020-05-19 MED ORDER — NA SULFATE-K SULFATE-MG SULF 17.5-3.13-1.6 GM/177ML PO SOLN: ORAL | 0 refills | Status: DC

## 2020-05-19 NOTE — Progress Notes (Signed)
Patient is here in-person for PV. Patient denies any allergies to eggs or soy. Patient denies any problems with anesthesia/sedation. Patient denies any oxygen use at home. Patient denies taking any diet/weight loss medications or blood thinners. Patient is not being treated for MRSA or C-diff. Patient is aware of our care-partner policy and [id number] safety protocol.    COVID-19 vaccines completed on 03/25/2020, per patient.

## 2020-06-05 ENCOUNTER — Encounter: Payer: Self-pay | Admitting: Gastroenterology

## 2020-06-05 ENCOUNTER — Other Ambulatory Visit: Payer: Self-pay

## 2020-06-05 ENCOUNTER — Ambulatory Visit (AMBULATORY_SURGERY_CENTER): Payer: Medicare PPO | Admitting: Gastroenterology

## 2020-06-05 VITALS — BP 118/66 | HR 77 | Temp 97.0°F | Resp 14 | Ht 63.0 in | Wt 142.0 lb

## 2020-06-05 DIAGNOSIS — D122 Benign neoplasm of ascending colon: Secondary | ICD-10-CM

## 2020-06-05 DIAGNOSIS — Z86010 Personal history of colonic polyps: Secondary | ICD-10-CM | POA: Diagnosis not present

## 2020-06-05 DIAGNOSIS — D125 Benign neoplasm of sigmoid colon: Secondary | ICD-10-CM

## 2020-06-05 DIAGNOSIS — D123 Benign neoplasm of transverse colon: Secondary | ICD-10-CM

## 2020-06-05 MED ORDER — SODIUM CHLORIDE 0.9 % IV SOLN: 500.0000 mL | Freq: Once | INTRAVENOUS | Status: DC

## 2020-06-05 NOTE — Progress Notes (Signed)
Pt's states no medical or surgical changes since previsit or office visit. 

## 2020-06-05 NOTE — Discharge Instructions (Signed)
Resume previous medications. Handouts on findings given to patient. Await pathology for final recommendations.  YOU HAD AN ENDOSCOPIC PROCEDURE TODAY AT THE [facility]:   Refer to the procedure report that was given to you for any specific questions about what was found during the examination.  If the procedure report does not answer your questions, please call your gastroenterologist to clarify.  If you requested that your care partner not be given the details of your procedure findings, then the procedure report has been included in a sealed envelope for you to review at your convenience later.  YOU SHOULD EXPECT: Some feelings of bloating in the abdomen. Passage of more gas than usual.  Walking can help get rid of the air that was put into your GI tract during the procedure and reduce the bloating. If you had a lower endoscopy (such as a colonoscopy or flexible sigmoidoscopy) you may notice spotting of blood in your stool or on the toilet paper. If you underwent a bowel prep for your procedure, you may not have a normal bowel movement for a few days.  Please Note:  You might notice some irritation and congestion in your nose or some drainage.  This is from the oxygen used during your procedure.  There is no need for concern and it should clear up in a day or so.  SYMPTOMS TO REPORT IMMEDIATELY:   Following lower endoscopy (colonoscopy or flexible sigmoidoscopy):  Excessive amounts of blood in the stool  Significant tenderness or worsening of abdominal pains  Swelling of the abdomen that is new, acute  Fever of 100F or higher  For urgent or emergent issues, a gastroenterologist can be reached at any hour by calling [phone]. Do not use MyChart messaging for urgent concerns.    DIET:  We do recommend a small meal at first, but then you may proceed to your regular diet.  Drink plenty of fluids but you should avoid alcoholic beverages for 24 hours.  ACTIVITY:  You should  plan to take it easy for the rest of today and you should NOT DRIVE or use heavy machinery until tomorrow (because of the sedation medicines used during the test).    FOLLOW UP: Our staff will call the number listed on your records 48-72 hours following your procedure to check on you and address any questions or concerns that you may have regarding the information given to you following your procedure. If we do not reach you, we will leave a message.  We will attempt to reach you two times.  During this call, we will ask if you have developed any symptoms of COVID 19. If you develop any symptoms (ie: fever, flu-like symptoms, shortness of breath, cough etc.) before then, please call [phone].  If you test positive for Covid 19 in the 2 weeks post procedure, please call and report this information to us.    If any biopsies were taken you will be contacted by phone or by letter within the next 1-3 weeks.  Please call us at [phone] if you have not heard about the biopsies in 3 weeks.    SIGNATURES/CONFIDENTIALITY: You and/or your care partner have signed paperwork which will be entered into your electronic medical record.  These signatures attest to the fact that that the information above on your After Visit Summary has been reviewed and is understood.  Full responsibility of the confidentiality of this discharge information lies with you and/or your care-partner. 

## 2020-06-05 NOTE — Op Note (Signed)
[facility] Patient Name: Pamela Wheeler Procedure Date: 06/05/2020 1:57 PM MRN: [id number] Endoscopist: Jackquline Denmark , MD Age: 69 Referring MD:  Date of Birth: 12/05/1950 Gender: Female Account #: [id number] Procedure:                Colonoscopy Indications:              High risk colon cancer surveillance: Personal                            history of colonic polyps Medicines:                Monitored Anesthesia Care Procedure:                Pre-Anesthesia Assessment:                           - Prior to the procedure, a History and Physical                            was performed, and patient medications and                            allergies were reviewed. The patient's tolerance of                            previous anesthesia was also reviewed. The risks                            and benefits of the procedure and the sedation                            options and risks were discussed with the patient.                            All questions were answered, and informed consent                            was obtained. Prior Anticoagulants: The patient has                            taken no previous anticoagulant or antiplatelet                            agents. ASA Grade Assessment: II - A patient with                            mild systemic disease. After reviewing the risks                            and benefits, the patient was deemed in                            satisfactory condition to undergo the procedure.  After obtaining informed consent, the colonoscope                            was passed under direct vision. Throughout the                            procedure, the patient's blood pressure, pulse, and                            oxygen saturations were monitored continuously. The                            Colonoscope was introduced through the anus and                            advanced to the the cecum,  identified by                            appendiceal orifice and ileocecal valve. The                            colonoscopy was performed without difficulty. The                            patient tolerated the procedure well. The quality                            of the bowel preparation was good. The ileocecal                            valve, appendiceal orifice, and rectum were                            photographed. Scope In: 2:13:25 PM Scope Out: 2:38:13 PM Scope Withdrawal Time: 0 hours 17 minutes 27 seconds  Total Procedure Duration: 0 hours 24 minutes 48 seconds  Findings:                 Eight sessile polyps were found in the sigmoid                            colon (1), proximal transverse colon (2), mid                            transverse colon (1) and ascending colon(4). The                            polyps were 4 to 6 mm in size. These polyps were                            removed with a cold snare. Resection and retrieval                            were complete. Estimated blood loss: none.  A few rare small-mouthed diverticula were found in                            the sigmoid colon.                           Non-bleeding internal hemorrhoids were found during                            retroflexion. The hemorrhoids were small.                           The exam was otherwise without abnormality on                            direct and retroflexion views. Complications:            No immediate complications. Estimated Blood Loss:     Estimated blood loss: none. Impression:               - Colonic polyps s/p polypectomy.                           - Minimal sigmoid diverticulosis.                           - Non-bleeding internal hemorrhoids.                           - The examination was otherwise normal on direct                            and retroflexion views. Recommendation:           - Patient has a contact number available for                             emergencies. The signs and symptoms of potential                            delayed complications were discussed with the                            patient. Return to normal activities tomorrow.                            Written discharge instructions were provided to the                            patient.                           - Resume previous diet.                           - Continue present medications.                           -  Await pathology results.                           - Repeat colonoscopy for surveillance based on                            pathology results.                           - The findings and recommendations were discussed                            with the patient's daughter Pamela Wheeler. Jackquline Denmark, MD 06/05/2020 2:46:11 PM This report has been signed electronically.

## 2020-06-05 NOTE — Progress Notes (Signed)
PT taken to PACU. Monitors in place. VSS. Report given to RN. 

## 2020-06-05 NOTE — Progress Notes (Signed)
Called to room to assist during endoscopic procedure.  Patient ID and intended procedure confirmed with present staff. Received instructions for my participation in the procedure from the performing physician.  

## 2020-06-07 ENCOUNTER — Telehealth: Payer: Self-pay | Admitting: *Deleted

## 2020-06-07 NOTE — Telephone Encounter (Signed)
  Follow up Call-  Call back number 06/05/2020  Post procedure Call Back phone  # [phone]  Permission to leave phone message Yes  Some recent data might be hidden     Patient questions:  Do you have a fever, pain , or abdominal swelling? No. Pain Score  0 *  Have you tolerated food without any problems? Yes.    Have you been able to return to your normal activities? Yes.    Do you have any questions about your discharge instructions: Diet   No. Medications  No. Follow up visit  No.  Do you have questions or concerns about your Care? Yes.    Actions: * If pain score is 4 or above: No action needed, pain <4.  1. Have you developed a fever since your procedure? no  2.   Have you had an respiratory symptoms (SOB or cough) since your procedure? no  3.   Have you tested positive for COVID 19 since your procedure no  4.   Have you had any family members/close contacts diagnosed with the COVID 19 since your procedure?  no   If yes to any of these questions please route to Joylene John, RN and Joella Prince, RN

## 2020-06-14 ENCOUNTER — Encounter: Payer: Self-pay | Admitting: Gastroenterology

## 2020-06-26 ENCOUNTER — Telehealth: Payer: Self-pay | Admitting: Gastroenterology

## 2020-06-26 NOTE — Telephone Encounter (Signed)
I sent her a copy of the letter from 12/16 to her MyChart.  I left her a detailed message with the results.  She is asked to call with questions.

## 2020-06-26 NOTE — Telephone Encounter (Signed)
Pt called inquiring about path results. She stated to be getting anxious. Pls call her.

## 2020-07-13 ENCOUNTER — Other Ambulatory Visit: Payer: Self-pay | Admitting: Internal Medicine

## 2020-07-13 DIAGNOSIS — Z0000 Encounter for general adult medical examination without abnormal findings: Secondary | ICD-10-CM

## 2020-08-28 ENCOUNTER — Other Ambulatory Visit: Payer: Self-pay

## 2020-08-28 ENCOUNTER — Ambulatory Visit
Admission: RE | Admit: 2020-08-28 | Discharge: 2020-08-28 | Disposition: A | Discharge status: Home / Self Care | Payer: Medicare PPO | Source: Ambulatory Visit | Attending: Internal Medicine | Admitting: Internal Medicine

## 2020-08-28 DIAGNOSIS — Z0000 Encounter for general adult medical examination without abnormal findings: Secondary | ICD-10-CM

## 2021-09-03 ENCOUNTER — Other Ambulatory Visit: Payer: Self-pay | Admitting: Internal Medicine

## 2021-09-03 DIAGNOSIS — Z1231 Encounter for screening mammogram for malignant neoplasm of breast: Secondary | ICD-10-CM

## 2021-09-05 ENCOUNTER — Ambulatory Visit
Admission: RE | Admit: 2021-09-05 | Discharge: 2021-09-05 | Disposition: A | Discharge status: Home / Self Care | Payer: Medicare PPO | Source: Ambulatory Visit | Attending: Internal Medicine | Admitting: Internal Medicine

## 2021-09-05 DIAGNOSIS — Z1231 Encounter for screening mammogram for malignant neoplasm of breast: Secondary | ICD-10-CM

## 2022-08-05 ENCOUNTER — Other Ambulatory Visit: Payer: Self-pay | Admitting: Internal Medicine

## 2022-08-05 DIAGNOSIS — Z1231 Encounter for screening mammogram for malignant neoplasm of breast: Secondary | ICD-10-CM

## 2022-08-22 IMAGING — MG MM DIGITAL SCREENING BILAT W/ TOMO AND CAD
8 series · 8 of 24 positions shown · non-contrast
Comparison: Previous exam(s).

CLINICAL DATA: Screening.

EXAM:
DIGITAL SCREENING BILATERAL MAMMOGRAM WITH TOMOSYNTHESIS AND CAD
TECHNIQUE: Bilateral screening digital craniocaudal and mediolateral oblique
mammograms were obtained. Bilateral screening digital breast
tomosynthesis was performed. The images were evaluated with
computer-aided detection.

[L CC synth-2D]
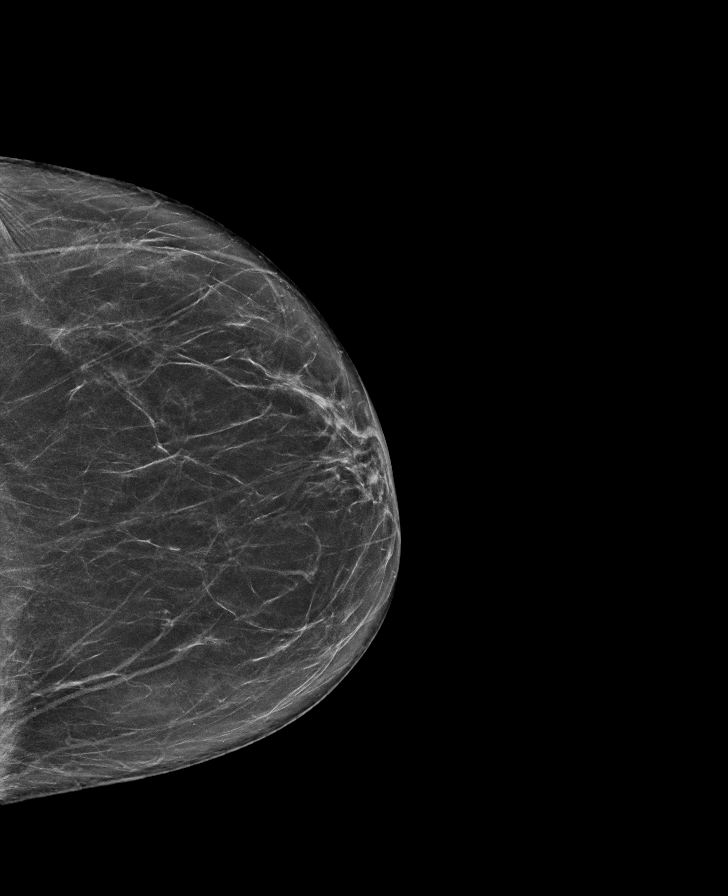

[L MLO synth-2D]
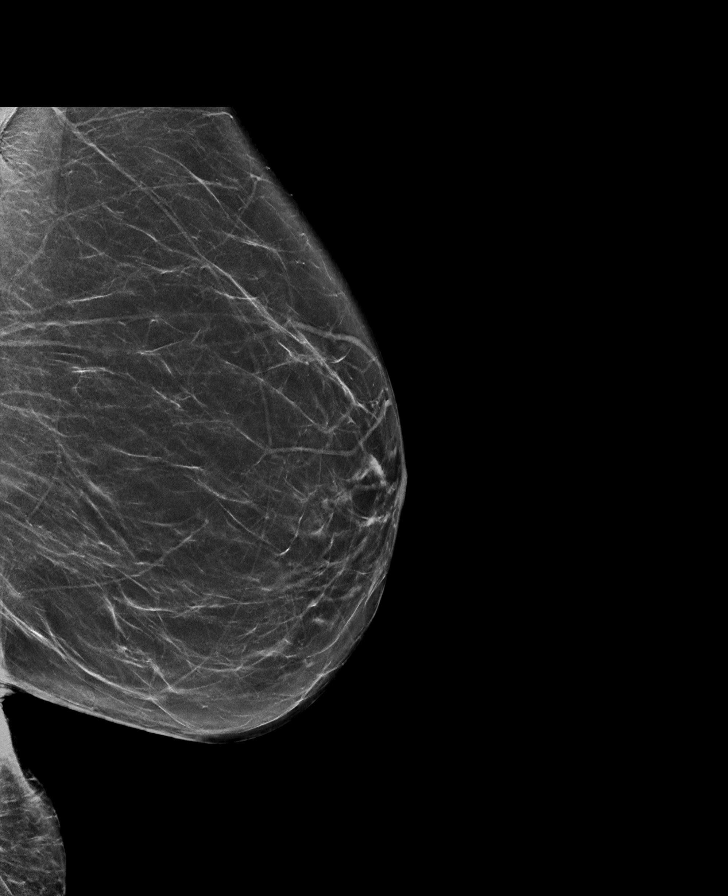

[R CC synth-2D]
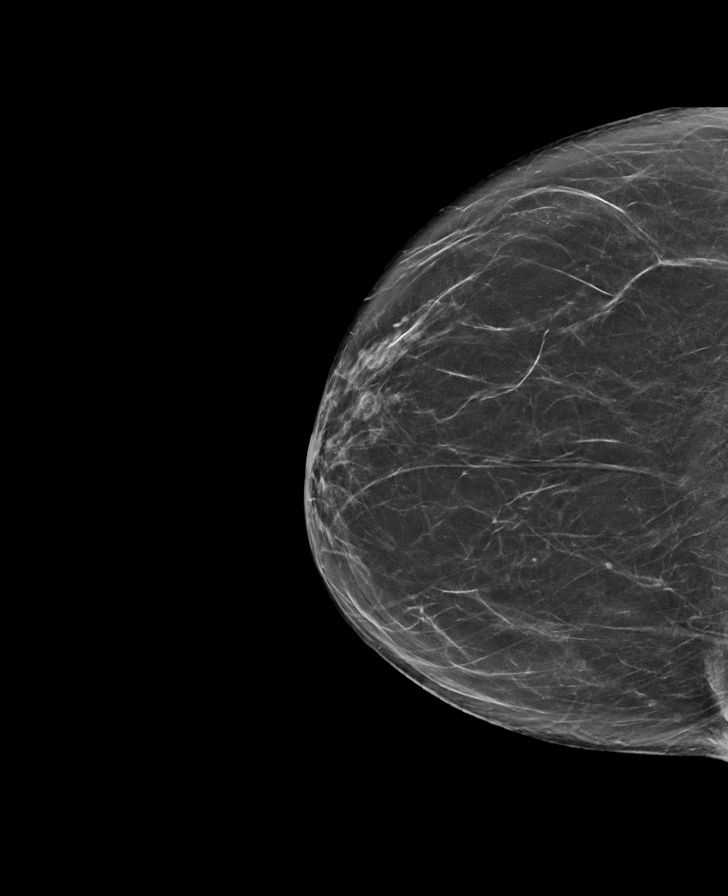

[R MLO synth-2D]
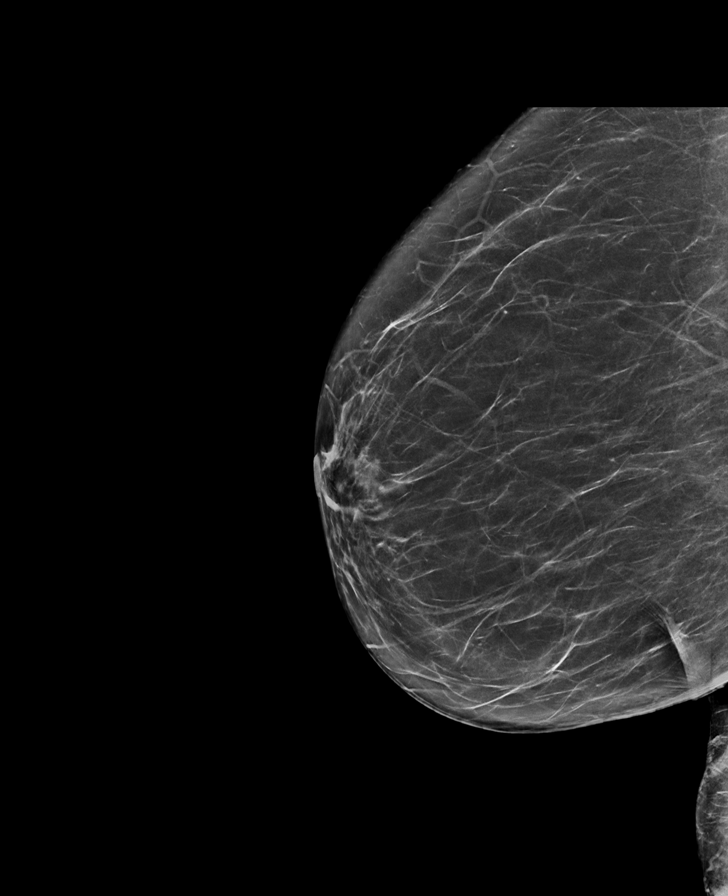

[L MLO tomo · tomo slice 37/74.0]
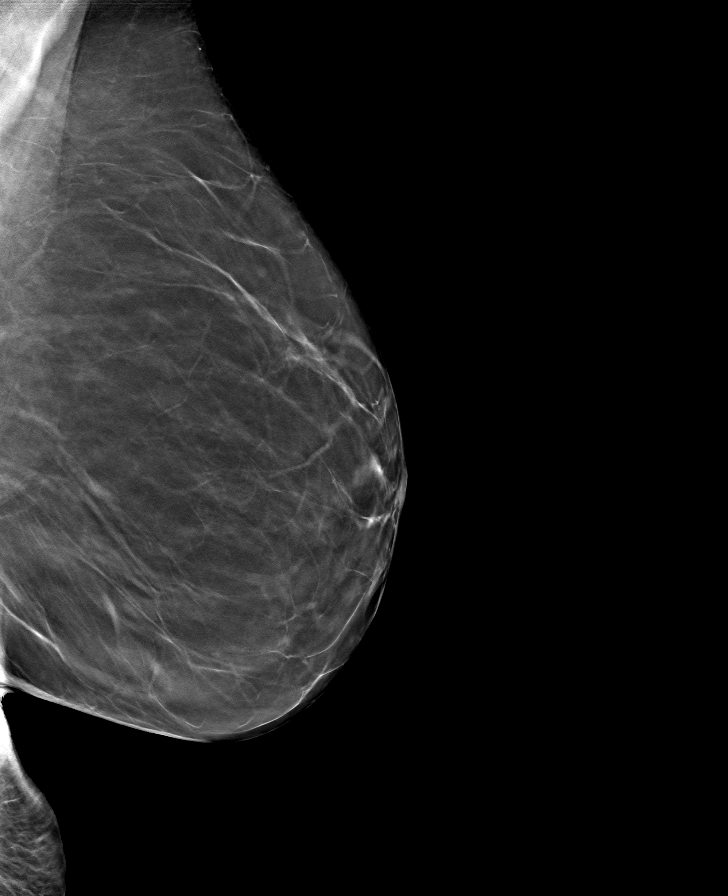

[L CC tomo · tomo slice 37/72.0]
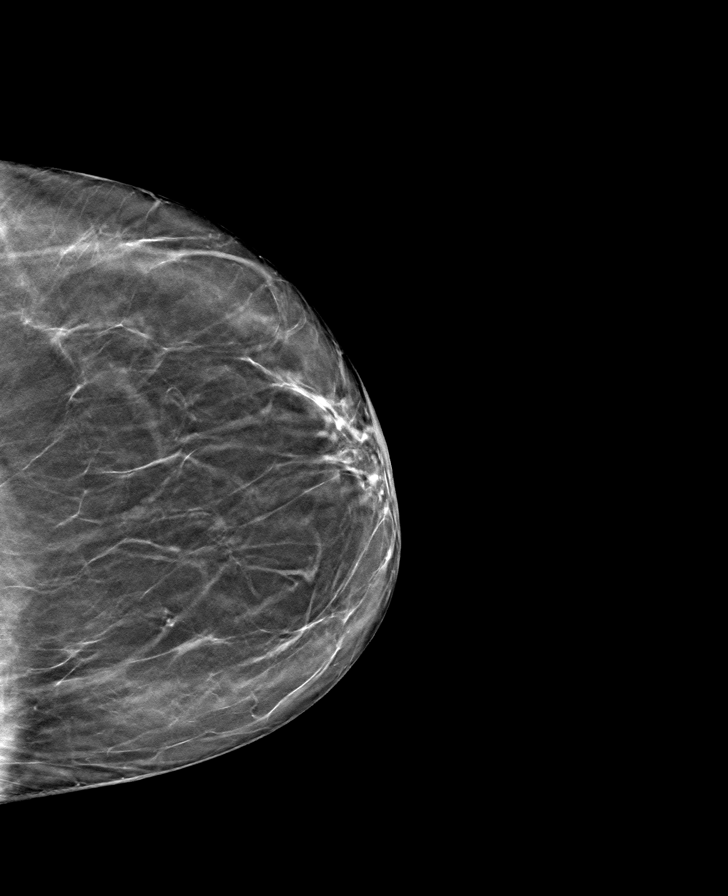

[R MLO tomo · tomo slice 37/74.0]
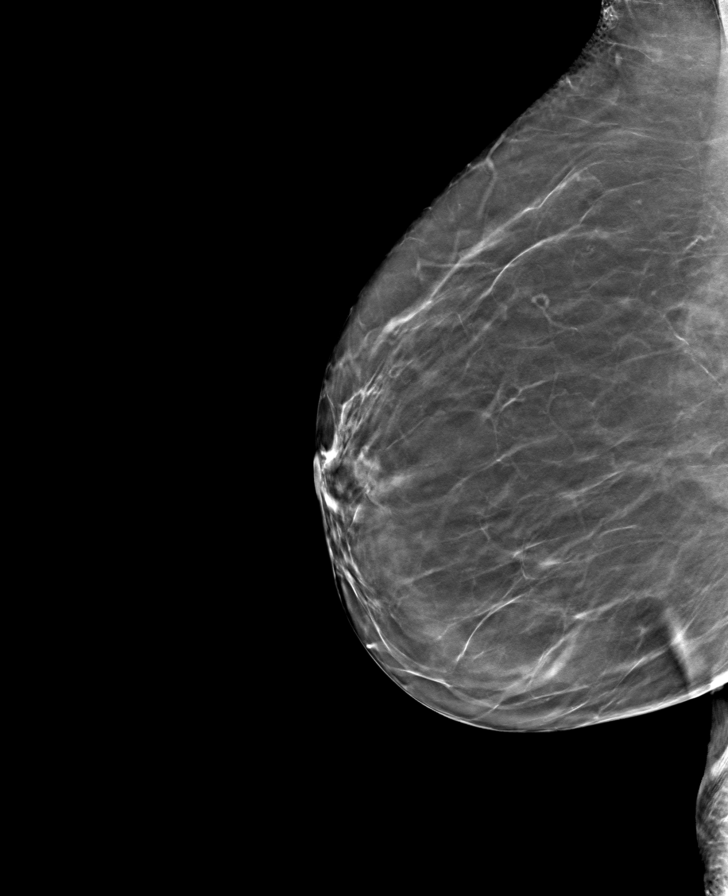

[R CC tomo · tomo slice 36/71.0]
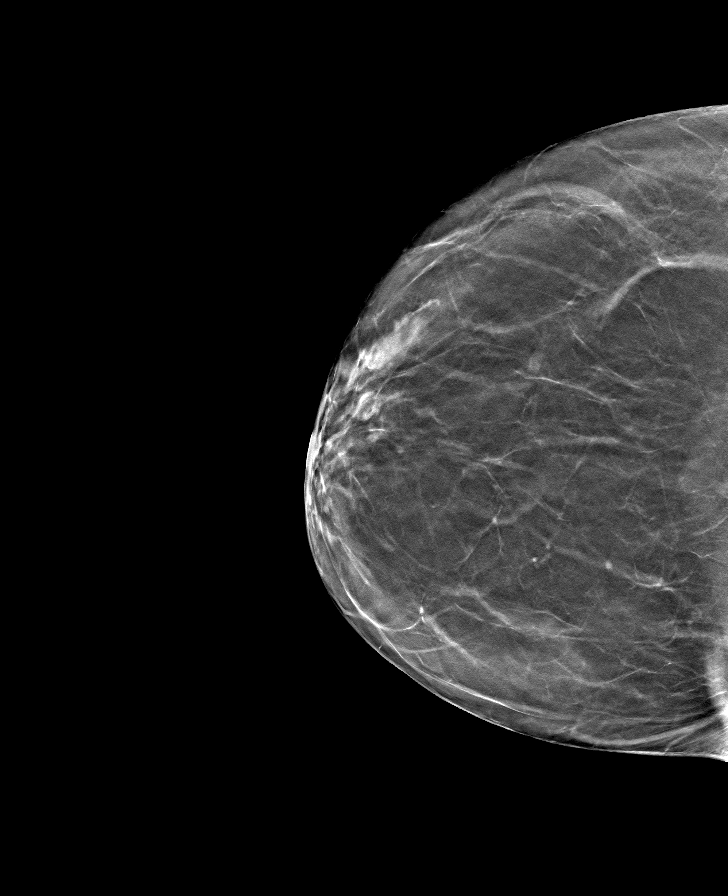

[8 of 24 positions shown; findings below may reference images not displayed]

ACR Breast Density Category b: There are scattered areas of
fibroglandular density.
FINDINGS: There are no findings suspicious for malignancy.
IMPRESSION: No mammographic evidence of malignancy. A result letter of this
screening mammogram will be mailed directly to the patient.

RECOMMENDATION:
Screening mammogram in one year. (Code:51-O-LD2)

BI-RADS CATEGORY  1: Negative.

## 2022-09-25 ENCOUNTER — Ambulatory Visit
Admission: RE | Admit: 2022-09-25 | Discharge: 2022-09-25 | Disposition: A | Discharge status: Home / Self Care | Payer: Medicare PPO | Source: Ambulatory Visit | Attending: Internal Medicine | Admitting: Internal Medicine

## 2022-09-25 DIAGNOSIS — Z1231 Encounter for screening mammogram for malignant neoplasm of breast: Secondary | ICD-10-CM

## 2022-12-20 ENCOUNTER — Encounter: Payer: Self-pay | Admitting: Gastroenterology

## 2022-12-20 ENCOUNTER — Ambulatory Visit: Payer: Medicare PPO | Admitting: Gastroenterology

## 2022-12-20 VITALS — BP 100/60 | HR 76 | Ht 63.0 in | Wt 125.5 lb

## 2022-12-20 DIAGNOSIS — R112 Nausea with vomiting, unspecified: Secondary | ICD-10-CM

## 2022-12-20 DIAGNOSIS — K219 Gastro-esophageal reflux disease without esophagitis: Secondary | ICD-10-CM | POA: Diagnosis not present

## 2022-12-20 DIAGNOSIS — Z86010 Personal history of colonic polyps: Secondary | ICD-10-CM

## 2022-12-20 MED ORDER — FAMOTIDINE 20 MG PO TABS: 20.0000 mg | ORAL_TABLET | Freq: Two times a day (BID) | ORAL | 3 refills | Status: AC

## 2022-12-20 NOTE — Patient Instructions (Addendum)
_______________________________________________________  If your blood pressure at your visit was 140/90 or greater, please contact your primary care physician to follow up on this.  _______________________________________________________  If you are age 74 or older, your body mass index should be between 23-30. Your Body mass index is 22.23 kg/m. If this is out of the aforementioned range listed, please consider follow up with your Primary Care Provider.  If you are age 21 or younger, your body mass index should be between 19-25. Your Body mass index is 22.23 kg/m. If this is out of the aformentioned range listed, please consider follow up with your Primary Care Provider.   ________________________________________________________  The [facility] providers would like to encourage you to use [facility] to communicate with providers for non-urgent requests or questions.  Due to long hold times on the telephone, sending your provider a message by [facility] may be a faster and more efficient way to get a response.  Please allow 48 business hours for a response.  Please remember that this is for non-urgent requests.  _______________________________________________________' We have sent the following medications to your pharmacy for you to pick up at your convenience: Pepcid  You have been scheduled for an endoscopy. Please follow written instructions given to you at your visit today. If you use inhalers (even only as needed), please bring them with you on the day of your procedure.  Repeat colonoscopy for 06-2023. Please call 2 months prior to schedule this. A letter will be sent as it gets closer.   Gastroesophageal Reflux Disease, Adult  Gastroesophageal reflux (GER) happens when acid from the stomach flows up into the tube that connects the mouth and the stomach (esophagus). Normally, food travels down the esophagus and stays in the stomach to be digested. With GER, food and stomach acid  sometimes move back up into the esophagus. You may have a disease called gastroesophageal reflux disease (GERD) if the reflux: Happens often. Causes frequent or very bad symptoms. Causes problems such as damage to the esophagus. When this happens, the esophagus becomes sore and swollen. Over time, GERD can make small holes (ulcers) in the lining of the esophagus. What are the causes? This condition is caused by a problem with the muscle between the esophagus and the stomach. When this muscle is weak or not normal, it does not close properly to keep food and acid from coming back up from the stomach. The muscle can be weak because of: Tobacco use. Pregnancy. Having a certain type of hernia (hiatal hernia). Alcohol use. Certain foods and drinks, such as coffee, chocolate, onions, and peppermint. What increases the risk? Being overweight. Having a disease that affects your connective tissue. Taking NSAIDs, such a ibuprofen. What are the signs or symptoms? Heartburn. Difficult or painful swallowing. The feeling of having a lump in the throat. A bitter taste in the mouth. Bad breath. Having a lot of saliva. Having an upset or bloated stomach. Burping. Chest pain. Different conditions can cause chest pain. Make sure you see your doctor if you have chest pain. Shortness of breath or wheezing. A long-term cough or a cough at night. Wearing away of the surface of teeth (tooth enamel). Weight loss. How is this treated? Making changes to your diet. Taking medicine. Having surgery. Treatment will depend on how bad your symptoms are. Follow these instructions at home: Eating and drinking  Follow a diet as told by your doctor. You may need to avoid foods and drinks such as: Coffee and tea, with or  without caffeine. Drinks that contain alcohol. Energy drinks and sports drinks. Bubbly (carbonated) drinks or sodas. Chocolate and cocoa. Peppermint and mint flavorings. Garlic and  onions. Horseradish. Spicy and acidic foods. These include peppers, chili powder, curry powder, vinegar, hot sauces, and BBQ sauce. Citrus fruit juices and citrus fruits, such as oranges, lemons, and limes. Tomato-based foods. These include red sauce, chili, salsa, and pizza with red sauce. Fried and fatty foods. These include donuts, french fries, potato chips, and high-fat dressings. High-fat meats. These include hot dogs, rib eye steak, sausage, ham, and bacon. High-fat dairy items, such as whole milk, butter, and cream cheese. Eat small meals often. Avoid eating large meals. Avoid drinking large amounts of liquid with your meals. Avoid eating meals during the 2-3 hours before bedtime. Avoid lying down right after you eat. Do not exercise right after you eat. Lifestyle  Do not smoke or use any products that contain nicotine or tobacco. If you need help quitting, ask your doctor. Try to lower your stress. If you need help doing this, ask your doctor. If you are overweight, lose an amount of weight that is healthy for you. Ask your doctor about a safe weight loss goal. General instructions Pay attention to any changes in your symptoms. Take over-the-counter and prescription medicines only as told by your doctor. Do not take aspirin, ibuprofen, or other NSAIDs unless your doctor says it is okay. Wear loose clothes. Do not wear anything tight around your waist. Raise (elevate) the head of your bed about 6 inches (15 cm). You may need to use a wedge to do this. Avoid bending over if this makes your symptoms worse. Keep all follow-up visits. Contact a doctor if: You have new symptoms. You lose weight and you do not know why. You have trouble swallowing or it hurts to swallow. You have wheezing or a cough that keeps happening. You have a hoarse voice. Your symptoms do not get better with treatment. Get help right away if: You have sudden pain in your arms, neck, jaw, teeth, or  back. You suddenly feel sweaty, dizzy, or light-headed. You have chest pain or shortness of breath. You vomit and the vomit is green, yellow, or black, or it looks like blood or coffee grounds. You faint. Your poop (stool) is red, bloody, or black. You cannot swallow, drink, or eat. These symptoms may represent a serious problem that is an emergency. Do not wait to see if the symptoms will go away. Get medical help right away. Call your local emergency services (911 in the [place] Do not drive yourself to the hospital. Summary If a person has gastroesophageal reflux disease (GERD), food and stomach acid move back up into the esophagus and cause symptoms or problems such as damage to the esophagus. Treatment will depend on how bad your symptoms are. Follow a diet as told by your doctor. Take all medicines only as told by your doctor. This information is not intended to replace advice given to you by your health care provider. Make sure you discuss any questions you have with your health care provider. Document Revised: 12/27/2019 Document Reviewed: 12/27/2019 Elsevier Patient Education  2024 [employer].

## 2022-12-20 NOTE — Progress Notes (Signed)
Chief Complaint:   Referring Provider:  Gordan Payment., MD      ASSESSMENT AND PLAN;   #1. Refractory GERD. Doesn't want to take PPIs on regular basis.  #2. Occ N/V  #3. H/O polyps. Recall colon 06/2023  Plan: -Try pepcid 20mg  po QD -EGD -Broch GERD -If still with problems, consider GES.   HPI:    Pamela Wheeler is a 72 y.o. female  With multiple medical problems including anxiety, migraine headaches, OCD, HTN, HLD, DJD, s/p cholecystectomy 2006  C/O GERD x 1.5 yrs With cough then at times vomiting She does have heartburn but adamantly refuses to take any PPIs on long-term basis.  She has been using omeprazole once per week.  If she takes more than that, she gets "muscle spasms" she is very much concerned about side effects from PPIs. Denies having any odynophagia or dysphagia Off coffee Does not use nonsteroidals  No melena or hematochezia.  Denies having any abdominal pain.  She denies having any diarrhea or constipation  Her hemoglobin A1c was elevated previously, trying very hard to lose wt as she does not want to go on metformin.  Labs from 10/28/2022 -Normal CMP Hemoglobin A1c 6.6 B12 1057 TSH 0.96 CRP less than 5.  Past GI procedures:  Colonoscopy 06/05/2020: -8 Colonic polyps s/p polypectomy. Bx-tubular adenomas -Repeat in 3 years.  Colonoscopy 2008, 2005-colonic polyps  SH- husband passed away-had Parkinson's.  She just bought a townhome here in [place].  Planning to sell her home in [place].  Past Medical History:  Diagnosis Date   Anxiety    OCD   Arthritis    Bone spur of other site    spine,  l-5   DJD (degenerative joint disease)    Hyperlipidemia    Hypertension    PONV (postoperative nausea and vomiting)    Sleep apnea    does not use CPAP    Past Surgical History:  Procedure Laterality Date   CHOLECYSTECTOMY  2006   COLONOSCOPY  last 02/06/2011   Chales Abrahams   POLYPECTOMY     TONSILLECTOMY     TOTAL HIP ARTHROPLASTY   06/19/2012   Procedure: TOTAL HIP ARTHROPLASTY ANTERIOR APPROACH;  Surgeon: Kathryne Hitch, MD;  Location: [facility];  Service: Orthopedics;  Laterality: Right;  Right Total Hip Arthroplasty, Anterior Approach (C-Arm)    Family History  Problem Relation Age of Onset   Breast cancer Mother        in 23's   Breast cancer Sister 67   Breast cancer Maternal Grandmother    Colon cancer Neg Hx    Colon polyps Neg Hx    Esophageal cancer Neg Hx    Rectal cancer Neg Hx    Stomach cancer Neg Hx     Social History   Tobacco Use   Smoking status: Never   Smokeless tobacco: Never  Vaping Use   Vaping Use: Never used  Substance Use Topics   Alcohol use: No   Drug use: No    Current Outpatient Medications  Medication Sig Dispense Refill   amLODipine (NORVASC) 5 MG tablet Take 5 mg by mouth daily before breakfast.     atorvastatin (LIPITOR) 40 MG tablet Take 40 mg by mouth every evening.     b complex vitamins capsule Take 1 capsule by mouth daily.     BIOTIN PO Take 1 capsule by mouth daily.     Calcium Carbonate-Vitamin D (CALTRATE 600+D PO) Take 1 tablet by mouth  2 (two) times daily.     escitalopram (LEXAPRO) 20 MG tablet Take 20 mg by mouth every evening.     omeprazole (PRILOSEC OTC) 20 MG tablet Take 20 mg by mouth as needed.     Vitamin D-Vitamin K (VITAMIN K2-VITAMIN D3 PO) Take 1 capsule by mouth daily.     Current Facility-Administered Medications  Medication Dose Route Frequency Provider Last Rate Last Admin   0.9 %  sodium chloride infusion  500 mL Intravenous Once Lynann Bologna, MD        Allergies  Allergen Reactions   Penicillins Other (See Comments)    UNKNOWN    Review of Systems:  neg Psychiatric/Behavioral: No anxiety or depression     Physical Exam:    BP 100/60 (BP Location: Left Arm, Patient Position: Sitting, Cuff Size: Normal)   Pulse 76   Ht 5\' 3"  (1.6 m)   Wt 125 lb 8 oz (56.9 kg)   BMI 22.23 kg/m  Wt Readings from Last 3 Encounters:   12/20/22 125 lb 8 oz (56.9 kg)  06/05/20 142 lb (64.4 kg)  05/19/20 142 lb (64.4 kg)   Constitutional:  Well-developed, in no acute distress. Psychiatric: Normal mood and affect. Behavior is normal. HEENT: Pupils normal.  Conjunctivae are normal. No scleral icterus. Cardiovascular: Normal rate, regular rhythm. No edema Pulmonary/chest: Effort normal and breath sounds normal. No wheezing, rales or rhonchi. Abdominal: Soft, nondistended. Nontender. Bowel sounds active throughout. There are no masses palpable. No hepatomegaly. Rectal: Deferred Neurological: Alert and oriented to person place and time. Skin: Skin is warm and dry. No rashes noted.    Edman Circle, MD 12/20/2022, 11:36 AM  Cc: Gordan Payment., MD

## 2023-02-24 ENCOUNTER — Encounter: Payer: Self-pay | Admitting: Gastroenterology

## 2023-03-03 ENCOUNTER — Encounter: Payer: Self-pay | Admitting: Certified Registered Nurse Anesthetist

## 2023-03-07 ENCOUNTER — Encounter: Payer: Self-pay | Admitting: Gastroenterology

## 2023-03-07 ENCOUNTER — Ambulatory Visit (AMBULATORY_SURGERY_CENTER): Payer: Medicare PPO | Admitting: Gastroenterology

## 2023-03-07 VITALS — BP 100/61 | HR 63 | Temp 96.8°F | Resp 16 | Ht 63.0 in | Wt 125.0 lb

## 2023-03-07 DIAGNOSIS — K219 Gastro-esophageal reflux disease without esophagitis: Secondary | ICD-10-CM

## 2023-03-07 DIAGNOSIS — K2950 Unspecified chronic gastritis without bleeding: Secondary | ICD-10-CM

## 2023-03-07 DIAGNOSIS — K229 Disease of esophagus, unspecified: Secondary | ICD-10-CM

## 2023-03-07 DIAGNOSIS — K2970 Gastritis, unspecified, without bleeding: Secondary | ICD-10-CM

## 2023-03-07 DIAGNOSIS — K2270 Barrett's esophagus without dysplasia: Secondary | ICD-10-CM

## 2023-03-07 MED ORDER — SODIUM CHLORIDE 0.9 % IV SOLN
500.0000 mL | INTRAVENOUS | Status: DC
Start: 2023-03-07 — End: 2023-03-07

## 2023-03-07 NOTE — Progress Notes (Signed)
Called to room to assist during endoscopic procedure.  Patient ID and intended procedure confirmed with present staff. Received instructions for my participation in the procedure from the performing physician.  

## 2023-03-07 NOTE — Op Note (Signed)
[facility] Patient Name: Pamela Wheeler Procedure Date: 03/07/2023 9:56 AM MRN: [id number] Endoscopist: Lynann Bologna , MD, [id number] Age: 72 Referring MD:  Date of Birth: May 24, 1951 Gender: Female Account #: [id number] Procedure:                Upper GI endoscopy Indications:              Heartburn with occ N/V Medicines:                Monitored Anesthesia Care Procedure:                Pre-Anesthesia Assessment:                           - Prior to the procedure, a History and Physical                            was performed, and patient medications and                            allergies were reviewed. The patient's tolerance of                            previous anesthesia was also reviewed. The risks                            and benefits of the procedure and the sedation                            options and risks were discussed with the patient.                            All questions were answered, and informed consent                            was obtained. Prior Anticoagulants: The patient has                            taken no anticoagulant or antiplatelet agents. ASA                            Grade Assessment: II - A patient with mild systemic                            disease. After reviewing the risks and benefits,                            the patient was deemed in satisfactory condition to                            undergo the procedure.                           After obtaining informed consent, the endoscope was  passed under direct vision. Throughout the                            procedure, the patient's blood pressure, pulse, and                            oxygen saturations were monitored continuously. The                            Olympus scope [id number] was introduced through the                            mouth, and advanced to the second part of duodenum.                            The upper GI endoscopy  was accomplished without                            difficulty. The patient tolerated the procedure                            well. Scope In: Scope Out: Findings:                 The lower third of the esophagus was mildly                            tortuous.                           The Z-line was irregular and was found 35 cm from                            the incisors. Biopsies were taken with a cold                            forceps for histology.                           Localized mild inflammation characterized by                            erythema was found in the gastric antrum. Biopsies                            were taken with a cold forceps for histology.                           The examined duodenum was normal. Biopsies for                            histology were taken with a cold forceps for                            evaluation of celiac disease. Complications:  No immediate complications. Estimated Blood Loss:     Estimated blood loss: none. Impression:               - Z-line irregular, 35 cm from the incisors.                            Biopsied.                           - Gastritis. Biopsied.                           - Mild presbyesophagus. Recommendation:           - Patient has a contact number available for                            emergencies. The signs and symptoms of potential                            delayed complications were discussed with the                            patient. Return to normal activities tomorrow.                            Written discharge instructions were provided to the                            patient.                           - Resume previous diet.                           - Continue present medications including famotidine                            20 mg p.o. twice daily.                           - Await pathology results.                           - Brochures regarding reflux.                            - The findings and recommendations were discussed                            with the patient's family. Lynann Bologna, MD 03/07/2023 10:18:09 AM This report has been signed electronically.

## 2023-03-07 NOTE — Progress Notes (Signed)
Pt's states no medical or surgical changes since previsit or office visit. 

## 2023-03-07 NOTE — Patient Instructions (Signed)

## 2023-03-07 NOTE — Progress Notes (Signed)
1000 Robinul 0.1 mg IV given due large amount of secretions upon assessment.  MD made aware, vss 

## 2023-03-07 NOTE — Progress Notes (Signed)
Report given to PACU, vss 

## 2023-03-07 NOTE — Progress Notes (Signed)
Chief Complaint:   Referring Provider:  Gordan Payment., MD      ASSESSMENT AND PLAN;   #1. Refractory GERD. Doesn't want to take PPIs on regular basis.  #2. Occ N/V  #3. H/O polyps. Recall colon 06/2023  Plan: -Try pepcid 20mg  po QD -EGD -Broch GERD -If still with problems, consider GES.   HPI:    Pamela Wheeler is a 72 y.o. female  With multiple medical problems including anxiety, migraine headaches, OCD, HTN, HLD, DJD, s/p cholecystectomy 2006  C/O GERD x 1.5 yrs With cough then at times vomiting She does have heartburn but adamantly refuses to take any PPIs on long-term basis.  She has been using omeprazole once per week.  If she takes more than that, she gets "muscle spasms" she is very much concerned about side effects from PPIs. Denies having any odynophagia or dysphagia Off coffee Does not use nonsteroidals  No melena or hematochezia.  Denies having any abdominal pain.  She denies having any diarrhea or constipation  Her hemoglobin A1c was elevated previously, trying very hard to lose wt as she does not want to go on metformin.  Labs from 10/28/2022 -Normal CMP Hemoglobin A1c 6.6 B12 1057 TSH 0.96 CRP less than 5.  Past GI procedures:  Colonoscopy 06/05/2020: -8 Colonic polyps s/p polypectomy. Bx-tubular adenomas -Repeat in 3 years.  Colonoscopy 2008, 2005-colonic polyps  SH- husband passed away-had Parkinson's.  She just bought a townhome here in [place].  Planning to sell her home in [place].  Past Medical History:  Diagnosis Date   Anxiety    OCD   Arthritis    Bone spur of other site    spine,  l-5   DJD (degenerative joint disease)    Hyperlipidemia    Hypertension    PONV (postoperative nausea and vomiting)    Sleep apnea    does not use CPAP    Past Surgical History:  Procedure Laterality Date   CHOLECYSTECTOMY  2006   COLONOSCOPY  last 02/06/2011   Chales Abrahams   POLYPECTOMY     TONSILLECTOMY     TOTAL HIP ARTHROPLASTY   06/19/2012   Procedure: TOTAL HIP ARTHROPLASTY ANTERIOR APPROACH;  Surgeon: Kathryne Hitch, MD;  Location: [facility];  Service: Orthopedics;  Laterality: Right;  Right Total Hip Arthroplasty, Anterior Approach (C-Arm)    Family History  Problem Relation Age of Onset   Breast cancer Mother        in 57's   Breast cancer Sister 106   Breast cancer Maternal Grandmother    Colon cancer Neg Hx    Colon polyps Neg Hx    Esophageal cancer Neg Hx    Rectal cancer Neg Hx    Stomach cancer Neg Hx     Social History   Tobacco Use   Smoking status: Never   Smokeless tobacco: Never  Vaping Use   Vaping status: Never Used  Substance Use Topics   Alcohol use: No   Drug use: No    Current Outpatient Medications  Medication Sig Dispense Refill   amLODipine (NORVASC) 5 MG tablet Take 5 mg by mouth daily before breakfast.     atorvastatin (LIPITOR) 40 MG tablet Take 40 mg by mouth every evening.     b complex vitamins capsule Take 1 capsule by mouth daily.     BIOTIN PO Take 1 capsule by mouth daily.     Calcium Carbonate-Vitamin D (CALTRATE 600+D PO) Take 1 tablet by mouth  2 (two) times daily.     Cholecalciferol (VITAMIN D) 125 MCG (5000 UT) CAPS Take 1 tablet by mouth daily.     escitalopram (LEXAPRO) 20 MG tablet Take 20 mg by mouth every evening.     famotidine (PEPCID) 20 MG tablet Take 1 tablet (20 mg total) by mouth 2 (two) times daily. 90 tablet 3   omeprazole (PRILOSEC OTC) 20 MG tablet Take 20 mg by mouth as needed. (Patient not taking: Reported on 03/07/2023)     Vitamin D-Vitamin K (VITAMIN K2-VITAMIN D3 PO) Take 1 capsule by mouth daily. (Patient not taking: Reported on 03/07/2023)     Current Facility-Administered Medications  Medication Dose Route Frequency Provider Last Rate Last Admin   0.9 %  sodium chloride infusion  500 mL Intravenous Once Lynann Bologna, MD       0.9 %  sodium chloride infusion  500 mL Intravenous Continuous Lynann Bologna, MD        Allergies   Allergen Reactions   Penicillins Other (See Comments)    UNKNOWN    Review of Systems:  neg Psychiatric/Behavioral: No anxiety or depression     Physical Exam:    BP 111/61   Pulse 63   Temp (!) 96.8 F (36 C)   Ht 5\' 3"  (1.6 m)   Wt 125 lb (56.7 kg)   SpO2 97%   BMI 22.14 kg/m  Wt Readings from Last 3 Encounters:  03/07/23 125 lb (56.7 kg)  12/20/22 125 lb 8 oz (56.9 kg)  06/05/20 142 lb (64.4 kg)   Constitutional:  Well-developed, in no acute distress. Psychiatric: Normal mood and affect. Behavior is normal. HEENT: Pupils normal.  Conjunctivae are normal. No scleral icterus. Cardiovascular: Normal rate, regular rhythm. No edema Pulmonary/chest: Effort normal and breath sounds normal. No wheezing, rales or rhonchi. Abdominal: Soft, nondistended. Nontender. Bowel sounds active throughout. There are no masses palpable. No hepatomegaly. Rectal: Deferred Neurological: Alert and oriented to person place and time. Skin: Skin is warm and dry. No rashes noted.    Edman Circle, MD 03/07/2023, 9:53 AM  Cc: Gordan Payment., MD

## 2023-03-10 ENCOUNTER — Telehealth: Payer: Self-pay

## 2023-03-10 NOTE — Telephone Encounter (Signed)
  Follow up Call-     03/07/2023    9:29 AM  Call back number  Post procedure Call Back phone  # [phone]  Permission to leave phone message Yes     Patient questions:  Do you have a fever, pain , or abdominal swelling? No. Pain Score  0 *  Have you tolerated food without any problems? Yes.    Have you been able to return to your normal activities? Yes.    Do you have any questions about your discharge instructions: Diet   No. Medications  No. Follow up visit  No.  Do you have questions or concerns about your Care? No.  Actions: * If pain score is 4 or above: No action needed, pain <4.

## 2023-03-12 ENCOUNTER — Encounter: Payer: Self-pay | Admitting: Gastroenterology

## 2023-06-16 ENCOUNTER — Ambulatory Visit: Payer: Medicare PPO | Admitting: Gastroenterology

## 2023-06-16 ENCOUNTER — Encounter: Payer: Self-pay | Admitting: Gastroenterology

## 2023-06-16 VITALS — BP 112/72 | HR 71 | Ht 63.0 in | Wt 118.0 lb

## 2023-06-16 DIAGNOSIS — Z860100 Personal history of colon polyps, unspecified: Secondary | ICD-10-CM

## 2023-06-16 DIAGNOSIS — K219 Gastro-esophageal reflux disease without esophagitis: Secondary | ICD-10-CM

## 2023-06-16 MED ORDER — FAMOTIDINE 20 MG PO TABS: 20.0000 mg | ORAL_TABLET | Freq: Two times a day (BID) | ORAL | 3 refills | Status: AC

## 2023-06-16 NOTE — Progress Notes (Unsigned)
Chief Complaint:   Referring Provider:  Gordan Payment., MD      ASSESSMENT AND PLAN;   #1. Refractory GERD. Doesn't want to take PPIs on regular basis. Last EGD with irregular Z-line. Bx: barretts  #2. H/O polyps.  Plan: -Try pepcid 20mg  po BID #180, 4RF ([employer]) -Colon with miralax -Rpt EGD 03/2026   HPI:    Pamela Wheeler is a 72 y.o. female  With multiple medical problems including anxiety, migraine headaches, OCD, HTN, HLD, DJD, s/p cholecystectomy 2006  Discussed the use of AI scribe software for clinical note transcription with the patient, who gave verbal consent to proceed.  History of Present Illness   The patient, with a history of Barrett's esophagus and polyps, presents for a follow-up consultation in preparation for a three-year colonoscopy. They report a change in residence and request a change in pharmacy for their prescription of famotidine, which they take twice daily for Barrett's esophagus. They express a desire to increase the dosage to three times daily due to persistent discomfort, which they attribute to dietary factors such as coffee, soda, ice cream, chocolate, and spicy foods.  The patient has been adhering to a diet of mostly broccoli, lettuce, and bread to mitigate symptoms. They express a reluctance to take proton pump inhibitors due to concerns about potential side effects. They also report a history of eight polyps found during their last colonoscopy, which was conducted eight years ago instead of the recommended five.  The patient has a family history of colon cancer, with their grandmother passing away from the disease at the age of 61. They express a strong desire to prevent colon cancer and are committed to regular colonoscopies as a preventative measure.       C/O GERD x 1.5 yrs With cough then at times vomiting She does have heartburn but adamantly refuses to take any PPIs on long-term basis.  She has been using omeprazole once per  week.  If she takes more than that, she gets "muscle spasms" she is very much concerned about side effects from PPIs. Denies having any odynophagia or dysphagia Off coffee Does not use nonsteroidals  No melena or hematochezia.  Denies having any abdominal pain.  She denies having any diarrhea or constipation  Her hemoglobin A1c was elevated previously, trying very hard to lose wt as she does not want to go on metformin.  Labs from 10/28/2022 -Normal CMP Hemoglobin A1c 6.6 B12 1057 TSH 0.96 CRP less than 5.  Past GI procedures:  Colonoscopy 06/05/2020: -8 Colonic polyps s/p polypectomy. Bx-tubular adenomas -Repeat in 3 years.  Colonoscopy 2008, 2005-colonic polyps  SH- husband passed away-had Parkinson's.  She just bought a townhome here in [place].  Planning to sell her home in [place].  Past Medical History:  Diagnosis Date   Anxiety    OCD   Arthritis    Barrett's esophagus    Bone spur of other site    spine,  l-5   DJD (degenerative joint disease)    Hyperlipidemia    Hypertension    PONV (postoperative nausea and vomiting)    Sleep apnea    does not use CPAP    Past Surgical History:  Procedure Laterality Date   CHOLECYSTECTOMY  2006   COLONOSCOPY  last 02/06/2011   Chales Abrahams   POLYPECTOMY     TONSILLECTOMY     TOTAL HIP ARTHROPLASTY  06/19/2012   Procedure: TOTAL HIP ARTHROPLASTY ANTERIOR APPROACH;  Surgeon: Kathryne Hitch, MD;  Location: [facility];  Service: Orthopedics;  Laterality: Right;  Right Total Hip Arthroplasty, Anterior Approach (C-Arm)    Family History  Problem Relation Age of Onset   Breast cancer Mother        in 53's   Breast cancer Sister 73   Breast cancer Maternal Grandmother    Colon cancer Neg Hx    Colon polyps Neg Hx    Esophageal cancer Neg Hx    Rectal cancer Neg Hx    Stomach cancer Neg Hx     Social History   Tobacco Use   Smoking status: Never   Smokeless tobacco: Never  Vaping Use   Vaping status: Never Used   Substance Use Topics   Alcohol use: No   Drug use: No    Current Outpatient Medications  Medication Sig Dispense Refill   amLODipine (NORVASC) 5 MG tablet Take 5 mg by mouth daily before breakfast.     atorvastatin (LIPITOR) 40 MG tablet Take 40 mg by mouth every evening.     b complex vitamins capsule Take 1 capsule by mouth daily.     BIOTIN PO Take 1 capsule by mouth daily.     Calcium Carbonate-Vitamin D (CALTRATE 600+D PO) Take 1 tablet by mouth 2 (two) times daily.     escitalopram (LEXAPRO) 20 MG tablet Take 20 mg by mouth every evening.     famotidine (PEPCID) 20 MG tablet Take 1 tablet (20 mg total) by mouth 2 (two) times daily. 90 tablet 3   Vitamin D-Vitamin K (VITAMIN K2-VITAMIN D3 PO) Take 1 capsule by mouth daily.     No current facility-administered medications for this visit.    Allergies  Allergen Reactions   Penicillins Other (See Comments)    UNKNOWN    Review of Systems:  neg Psychiatric/Behavioral: No anxiety or depression     Physical Exam:    BP 112/72   Pulse 71   Ht 5\' 3"  (1.6 m)   Wt 118 lb (53.5 kg)   SpO2 95%   BMI 20.90 kg/m  Wt Readings from Last 3 Encounters:  06/16/23 118 lb (53.5 kg)  03/07/23 125 lb (56.7 kg)  12/20/22 125 lb 8 oz (56.9 kg)   Constitutional:  Well-developed, in no acute distress. Psychiatric: Normal mood and affect. Behavior is normal. HEENT: Pupils normal.  Conjunctivae are normal. No scleral icterus. Cardiovascular: Normal rate, regular rhythm. No edema Pulmonary/chest: Effort normal and breath sounds normal. No wheezing, rales or rhonchi. Abdominal: Soft, nondistended. Nontender. Bowel sounds active throughout. There are no masses palpable. No hepatomegaly. Rectal: Deferred Neurological: Alert and oriented to person place and time. Skin: Skin is warm and dry. No rashes noted.    Edman Circle, MD 06/16/2023, 4:04 PM  Cc: Gordan Payment., MD

## 2023-06-16 NOTE — Patient Instructions (Addendum)
_______________________________________________________  If your blood pressure at your visit was 140/90 or greater, please contact your primary care physician to follow up on this.  _______________________________________________________  If you are age 74 or older, your body mass index should be between 23-30. Your Body mass index is 20.9 kg/m. If this is out of the aforementioned range listed, please consider follow up with your Primary Care Provider.  If you are age 98 or younger, your body mass index should be between 19-25. Your Body mass index is 20.9 kg/m. If this is out of the aformentioned range listed, please consider follow up with your Primary Care Provider.   ________________________________________________________  The [facility] providers would like to encourage you to use [facility] to communicate with providers for non-urgent requests or questions.  Due to long hold times on the telephone, sending your provider a message by [facility] may be a faster and more efficient way to get a response.  Please allow 48 business hours for a response.  Please remember that this is for non-urgent requests.  _______________________________________________________  We have sent the following medications to your pharmacy for you to pick up at your convenience: Pepcid  Repeat EGD for 03-2026. Please call 2 months prior to schedule this. A letter will be sent as it gets closer.  You have been scheduled for a colonoscopy. Please follow written instructions given to you at your visit today.   Please pick up your prep supplies at the pharmacy within the next 1-3 days.  If you use inhalers (even only as needed), please bring them with you on the day of your procedure.  DO NOT TAKE 7 DAYS PRIOR TO TEST- Trulicity (dulaglutide) Ozempic, Wegovy (semaglutide) Mounjaro (tirzepatide) Bydureon Bcise (exanatide extended release)  DO NOT TAKE 1 DAY PRIOR TO YOUR TEST Rybelsus (semaglutide) Adlyxin  (lixisenatide) Victoza (liraglutide) Byetta (exanatide) ___________________________________________________________________________  Thank you,  Dr. Lynann Bologna

## 2023-07-23 ENCOUNTER — Encounter: Payer: Self-pay | Admitting: Gastroenterology

## 2023-07-31 ENCOUNTER — Encounter: Payer: Self-pay | Admitting: Gastroenterology

## 2023-07-31 ENCOUNTER — Ambulatory Visit: Payer: Medicare PPO | Admitting: Gastroenterology

## 2023-07-31 VITALS — BP 106/60 | HR 61 | Temp 97.4°F | Resp 18 | Ht 63.0 in | Wt 118.0 lb

## 2023-07-31 DIAGNOSIS — Z860101 Personal history of adenomatous and serrated colon polyps: Secondary | ICD-10-CM

## 2023-07-31 DIAGNOSIS — K640 First degree hemorrhoids: Secondary | ICD-10-CM | POA: Diagnosis not present

## 2023-07-31 DIAGNOSIS — D122 Benign neoplasm of ascending colon: Secondary | ICD-10-CM

## 2023-07-31 DIAGNOSIS — K5730 Diverticulosis of large intestine without perforation or abscess without bleeding: Secondary | ICD-10-CM

## 2023-07-31 DIAGNOSIS — Z860100 Personal history of colon polyps, unspecified: Secondary | ICD-10-CM

## 2023-07-31 DIAGNOSIS — Z1211 Encounter for screening for malignant neoplasm of colon: Secondary | ICD-10-CM

## 2023-07-31 MED ORDER — SODIUM CHLORIDE 0.9 % IV SOLN: 500.0000 mL | Freq: Once | INTRAVENOUS | Status: DC

## 2023-07-31 NOTE — Progress Notes (Signed)
Called to room to assist during endoscopic procedure.  Patient ID and intended procedure confirmed with present staff. Received instructions for my participation in the procedure from the performing physician.

## 2023-07-31 NOTE — Patient Instructions (Signed)
Handouts Provided:  Polyps and Diverticulosis  YOU HAD AN ENDOSCOPIC PROCEDURE TODAY AT THE [facility]:   Refer to the procedure report that was given to you for any specific questions about what was found during the examination.  If the procedure report does not answer your questions, please call your gastroenterologist to clarify.  If you requested that your care partner not be given the details of your procedure findings, then the procedure report has been included in a sealed envelope for you to review at your convenience later.  YOU SHOULD EXPECT: Some feelings of bloating in the abdomen. Passage of more gas than usual.  Walking can help get rid of the air that was put into your GI tract during the procedure and reduce the bloating. If you had a lower endoscopy (such as a colonoscopy or flexible sigmoidoscopy) you may notice spotting of blood in your stool or on the toilet paper. If you underwent a bowel prep for your procedure, you may not have a normal bowel movement for a few days.  Please Note:  You might notice some irritation and congestion in your nose or some drainage.  This is from the oxygen used during your procedure.  There is no need for concern and it should clear up in a day or so.  SYMPTOMS TO REPORT IMMEDIATELY:  Following lower endoscopy (colonoscopy or flexible sigmoidoscopy):  Excessive amounts of blood in the stool  Significant tenderness or worsening of abdominal pains  Swelling of the abdomen that is new, acute  Fever of 100F or higher  For urgent or emergent issues, a gastroenterologist can be reached at any hour by calling [phone]. Do not use MyChart messaging for urgent concerns.    DIET:  We do recommend a small meal at first, but then you may proceed to your regular diet.  Drink plenty of fluids but you should avoid alcoholic beverages for 24 hours.  ACTIVITY:  You should plan to take it easy for the rest of today and you should NOT DRIVE  or use heavy machinery until tomorrow (because of the sedation medicines used during the test).    FOLLOW UP: Our staff will call the number listed on your records the next business day following your procedure.  We will call around 7:15- 8:00 am to check on you and address any questions or concerns that you may have regarding the information given to you following your procedure. If we do not reach you, we will leave a message.     If any biopsies were taken you will be contacted by phone or by letter within the next 1-3 weeks.  Please call us at [phone] if you have not heard about the biopsies in 3 weeks.    SIGNATURES/CONFIDENTIALITY: You and/or your care partner have signed paperwork which will be entered into your electronic medical record.  These signatures attest to the fact that that the information above on your After Visit Summary has been reviewed and is understood.  Full responsibility of the confidentiality of this discharge information lies with you and/or your care-partner.

## 2023-07-31 NOTE — Progress Notes (Signed)
Report to PACU, RN, vss, BBS= Clear.

## 2023-07-31 NOTE — Op Note (Signed)
[facility] Patient Name: Pamela Wheeler Procedure Date: 07/31/2023 7:23 AM MRN: [id number] Endoscopist: Lynann Bologna , MD, [id number] Age: 73 Referring MD:  Date of Birth: Nov 10, 1950 Gender: Female Account #: [id number] Procedure:                Colonoscopy Indications:              High risk colon cancer surveillance: Personal                            history of colonic polyps Medicines:                Monitored Anesthesia Care Procedure:                Pre-Anesthesia Assessment:                           - Prior to the procedure, a History and Physical                            was performed, and patient medications and                            allergies were reviewed. The patient's tolerance of                            previous anesthesia was also reviewed. The risks                            and benefits of the procedure and the sedation                            options and risks were discussed with the patient.                            All questions were answered, and informed consent                            was obtained. Prior Anticoagulants: The patient has                            taken no anticoagulant or antiplatelet agents. ASA                            Grade Assessment: II - A patient with mild systemic                            disease. After reviewing the risks and benefits,                            the patient was deemed in satisfactory condition to                            undergo the procedure.  After obtaining informed consent, the colonoscope                            was passed under direct vision. Throughout the                            procedure, the patient's blood pressure, pulse, and                            oxygen saturations were monitored continuously. The                            Olympus Scope [id number] was introduced through the                            and advanced to the the cecum,  identified by                            appendiceal orifice and ileocecal valve. The                            colonoscopy was performed without difficulty. The                            patient tolerated the procedure well. The quality                            of the bowel preparation was good. The ileocecal                            valve, appendiceal orifice, and rectum were                            photographed. Scope In: 7:38:20 AM Scope Out: 7:55:59 AM Scope Withdrawal Time: 0 hours 10 minutes 0 seconds  Total Procedure Duration: 0 hours 17 minutes 39 seconds  Findings:                 A 2 mm polyp was found in the distal ascending                            colon. The polyp was sessile. The polyp was removed                            with a cold snare. Resection and retrieval were                            complete.                           A few medium-mouthed diverticula were found in the                            sigmoid colon and ascending colon.  Non-bleeding external and internal hemorrhoids were                            found during retroflexion and during perianal exam.                            The hemorrhoids were small and Grade I (internal                            hemorrhoids that do not prolapse).                           The exam was otherwise without abnormality on                            direct and retroflexion views. Complications:            No immediate complications. Estimated Blood Loss:     Estimated blood loss: none. Impression:               - One 2 mm polyp in the distal ascending colon,                            removed with a cold snare. Resected and retrieved.                           - Mild colonic diverticulosis.                           - Non-bleeding external and internal hemorrhoids.                           - The examination was otherwise normal on direct                            and  retroflexion views. Recommendation:           - Patient has a contact number available for                            emergencies. The signs and symptoms of potential                            delayed complications were discussed with the                            patient. Return to normal activities tomorrow.                            Written discharge instructions were provided to the                            patient.                           - Resume previous diet.                           -  Continue present medications.                           - Await pathology results.                           - Repeat colonoscopy for surveillance is not needed                            d/t age. Hence repeat colonoscopy only if there are                            any new problems.                           - The findings and recommendations were discussed                            with the patient. Lynann Bologna, MD 07/31/2023 8:02:04 AM This report has been signed electronically.

## 2023-07-31 NOTE — Progress Notes (Signed)
[facility] History and Physical   Primary Care Physician:  Gordan Payment., MD   Reason for Procedure:   H/O polyps  Plan:    colon     HPI: Pamela Wheeler is a 73 y.o. female    Past Medical History:  Diagnosis Date   Anxiety    OCD   Arthritis    Barrett's esophagus    Bone spur of other site    spine,  l-5   DJD (degenerative joint disease)    Hyperlipidemia    Hypertension    PONV (postoperative nausea and vomiting)    Sleep apnea    does not use CPAP    Past Surgical History:  Procedure Laterality Date   CHOLECYSTECTOMY  2006   COLONOSCOPY  last 02/06/2011   Chales Abrahams   POLYPECTOMY     TONSILLECTOMY     TOTAL HIP ARTHROPLASTY  06/19/2012   Procedure: TOTAL HIP ARTHROPLASTY ANTERIOR APPROACH;  Surgeon: Kathryne Hitch, MD;  Location: [facility];  Service: Orthopedics;  Laterality: Right;  Right Total Hip Arthroplasty, Anterior Approach (C-Arm)    Prior to Admission medications   Medication Sig Start Date End Date Taking? Authorizing Provider  amLODipine (NORVASC) 5 MG tablet Take 5 mg by mouth daily before breakfast.   Yes [provider]  atorvastatin (LIPITOR) 40 MG tablet Take 40 mg by mouth every evening.   Yes [provider]  b complex vitamins capsule Take 1 capsule by mouth daily.   Yes [provider]  Calcium Carbonate-Vitamin D (CALTRATE 600+D PO) Take 1 tablet by mouth 2 (two) times daily.   Yes [provider]  escitalopram (LEXAPRO) 20 MG tablet Take 20 mg by mouth every evening.   Yes [provider]  famotidine (PEPCID) 20 MG tablet Take 1 tablet (20 mg total) by mouth 2 (two) times daily. 12/20/22  Yes Lynann Bologna, MD  Vitamin D-Vitamin K (VITAMIN K2-VITAMIN D3 PO) Take 1 capsule by mouth daily.   Yes [provider]  BIOTIN PO Take 1 capsule by mouth daily.    [provider]  famotidine (PEPCID) 20 MG tablet Take 1 tablet (20 mg total) by mouth 2 (two) times  daily. 06/16/23   Lynann Bologna, MD    Current Outpatient Medications  Medication Sig Dispense Refill   amLODipine (NORVASC) 5 MG tablet Take 5 mg by mouth daily before breakfast.     atorvastatin (LIPITOR) 40 MG tablet Take 40 mg by mouth every evening.     b complex vitamins capsule Take 1 capsule by mouth daily.     Calcium Carbonate-Vitamin D (CALTRATE 600+D PO) Take 1 tablet by mouth 2 (two) times daily.     escitalopram (LEXAPRO) 20 MG tablet Take 20 mg by mouth every evening.     famotidine (PEPCID) 20 MG tablet Take 1 tablet (20 mg total) by mouth 2 (two) times daily. 90 tablet 3   Vitamin D-Vitamin K (VITAMIN K2-VITAMIN D3 PO) Take 1 capsule by mouth daily.     BIOTIN PO Take 1 capsule by mouth daily.     famotidine (PEPCID) 20 MG tablet Take 1 tablet (20 mg total) by mouth 2 (two) times daily. 180 tablet 3   Current Facility-Administered Medications  Medication Dose Route Frequency Provider Last Rate Last Admin   0.9 %  sodium chloride infusion  500 mL Intravenous Once Lynann Bologna, MD        Allergies as of 07/31/2023 - Review Complete 07/31/2023  Allergen Reaction Noted   Penicillins Other (See Comments) 06/12/2012    Family History  Problem Relation Age of Onset   Breast cancer Mother        in 12's   Breast cancer Sister 51   Breast cancer Maternal Grandmother    Colon cancer Neg Hx    Colon polyps Neg Hx    Esophageal cancer Neg Hx    Rectal cancer Neg Hx    Stomach cancer Neg Hx     Social History   Socioeconomic History   Marital status: Widowed    Spouse name: Not on file   Number of children: 1   Years of education: Not on file   Highest education level: Not on file  Occupational History   Not on file  Tobacco Use   Smoking status: Never   Smokeless tobacco: Never  Vaping Use   Vaping status: Never Used  Substance and Sexual Activity   Alcohol use: No   Drug use: No   Sexual activity: Not on file  Other Topics Concern   Not on file   Social History Narrative   Not on file   Social Drivers of Health   Financial Resource Strain: Not on file  Food Insecurity: Low Risk  (04/30/2023)   Received from [facility]   Hunger Vital Sign    Worried About Running Out of Food in the Last Year: Never true    Ran Out of Food in the Last Year: Never true  Transportation Needs: No Transportation Needs (04/30/2023)   Received from [facility]    In the past 12 months, has lack of reliable transportation kept you from medical appointments, meetings, work or from getting things needed for daily living? : No  Physical Activity: Not on file  Stress: Not on file  Social Connections: Not on file  Intimate Partner Violence: Not on file    Review of Systems: Positive for none All other review of systems negative except as mentioned in the HPI.  Physical Exam: Vital signs in last 24 hours: @VSRANGES @   General:   Alert,  Well-developed, well-nourished, pleasant and cooperative in NAD Lungs:  Clear throughout to auscultation.   Heart:  Regular rate and rhythm; no murmurs, clicks, rubs,  or gallops. Abdomen:  Soft, nontender and nondistended. Normal bowel sounds.   Neuro/Psych:  Alert and cooperative. Normal mood and affect. A and O x 3    No significant changes were identified.  The patient continues to be an appropriate candidate for the planned procedure and anesthesia.   Edman Circle, MD. [facility] 07/31/2023 7:30 AM@

## 2023-08-01 ENCOUNTER — Telehealth: Payer: Self-pay

## 2023-08-01 NOTE — Telephone Encounter (Signed)
  Follow up Call-     07/31/2023    7:14 AM 03/07/2023    9:29 AM  Call back number  Post procedure Call Back phone  # [phone]  Permission to leave phone message No Yes     Patient questions:  Do you have a fever, pain , or abdominal swelling? No. Pain Score  0 *  Have you tolerated food without any problems? Yes.    Have you been able to return to your normal activities? Yes.    Do you have any questions about your discharge instructions: Diet   No. Medications  No. Follow up visit  No.  Do you have questions or concerns about your Care? No.  Actions: * If pain score is 4 or above: No action needed, pain <4.

## 2023-08-04 LAB — SURGICAL PATHOLOGY

## 2023-08-14 ENCOUNTER — Encounter: Payer: Self-pay | Admitting: Gastroenterology

## 2023-09-15 ENCOUNTER — Other Ambulatory Visit: Payer: Self-pay | Admitting: Internal Medicine

## 2023-09-15 DIAGNOSIS — Z1231 Encounter for screening mammogram for malignant neoplasm of breast: Secondary | ICD-10-CM

## 2023-10-01 ENCOUNTER — Ambulatory Visit
Admission: RE | Admit: 2023-10-01 | Discharge: 2023-10-01 | Disposition: A | Discharge status: Home / Self Care | Source: Ambulatory Visit | Attending: Internal Medicine | Admitting: Internal Medicine

## 2023-10-01 DIAGNOSIS — Z1231 Encounter for screening mammogram for malignant neoplasm of breast: Secondary | ICD-10-CM

## 2023-12-17 ENCOUNTER — Other Ambulatory Visit: Payer: Self-pay | Admitting: Orthopaedic Surgery

## 2023-12-17 DIAGNOSIS — G8929 Other chronic pain: Secondary | ICD-10-CM

## 2023-12-22 ENCOUNTER — Ambulatory Visit
Admission: RE | Admit: 2023-12-22 | Discharge: 2023-12-22 | Disposition: A | Discharge status: Home / Self Care | Source: Ambulatory Visit | Attending: Orthopaedic Surgery

## 2023-12-22 DIAGNOSIS — G8929 Other chronic pain: Secondary | ICD-10-CM

## 2024-02-03 NOTE — H&P (Signed)
 PREOPERATIVE H&P  Chief Complaint: Primary osteoarthritis of right shoulder  HPI: Pamela Wheeler is a 73 y.o. female who is scheduled for Procedure(s): ARTHROPLASTY, SHOULDER, TOTAL, REVERSE.   Patient is a 73 year-old who has a history of well controlled diabetes, on Metformin, who has had guided injections in the right shoulder in 2020, which did not bring her much relief.  She is now frustrated by her shoulder.  She has significant limitations of motion.  She had an injury a few years ago.  She had an acute injury to the shoulder and was not able to raise her arm afterwards.    Symptoms are rated as moderate to severe, and have been worsening.  This is significantly impairing activities of daily living.    Please see clinic note for further details on this patient's care.    She has elected for surgical management.   Past Medical History:  Diagnosis Date   Anxiety    OCD   Arthritis    Barrett's esophagus    Bone spur of other site    spine,  l-5   DJD (degenerative joint disease)    Hyperlipidemia    Hypertension    PONV (postoperative nausea and vomiting)    Sleep apnea    does not use CPAP   Past Surgical History:  Procedure Laterality Date   CHOLECYSTECTOMY  2006   COLONOSCOPY  last 02/06/2011   Charlanne   POLYPECTOMY     TONSILLECTOMY     TOTAL HIP ARTHROPLASTY  06/19/2012   Procedure: TOTAL HIP ARTHROPLASTY ANTERIOR APPROACH;  Surgeon: Lonni CINDERELLA Poli, MD;  Location: [facility];  Service: Orthopedics;  Laterality: Right;  Right Total Hip Arthroplasty, Anterior Approach (C-Arm)   Social History   Socioeconomic History   Marital status: Widowed    Spouse name: Not on file   Number of children: 1   Years of education: Not on file   Highest education level: Not on file  Occupational History   Not on file  Tobacco Use   Smoking status: Never   Smokeless tobacco: Never  Vaping Use   Vaping status: Never Used  Substance and Sexual Activity    Alcohol use: No   Drug use: No   Sexual activity: Not on file  Other Topics Concern   Not on file  Social History Narrative   Not on file   Social Drivers of Health   Financial Resource Strain: Not on file  Food Insecurity: Low Risk  (04/30/2023)   Received from [facility]   Hunger Vital Sign    Within the past 12 months, you worried that your food would run out before you got money to buy more: Never true    Within the past 12 months, the food you bought just didn't last and you didn't have money to get more. : Never true  Transportation Needs: No Transportation Needs (04/30/2023)   Received from [facility]    In the past 12 months, has lack of reliable transportation kept you from medical appointments, meetings, work or from getting things needed for daily living? : No  Physical Activity: Not on file  Stress: Not on file  Social Connections: Not on file   Family History  Problem Relation Age of Onset   Breast cancer Mother        in 50's   Breast cancer Sister 45   Breast cancer Maternal Grandmother    Colon cancer Neg Hx  Colon polyps Neg Hx    Esophageal cancer Neg Hx    Rectal cancer Neg Hx    Stomach cancer Neg Hx    Allergies  Allergen Reactions   Penicillins Other (See Comments)    UNKNOWN   Prior to Admission medications   Medication Sig Start Date End Date Taking? Authorizing Provider  amLODipine  (NORVASC ) 5 MG tablet Take 5 mg by mouth daily before breakfast.    [provider]  atorvastatin  (LIPITOR) 40 MG tablet Take 40 mg by mouth every evening.    [provider]  b complex vitamins capsule Take 1 capsule by mouth daily.    [provider]  BIOTIN PO Take 1 capsule by mouth daily.    [provider]  Calcium  Carbonate-Vitamin D (CALTRATE 600+D PO) Take 1 tablet by mouth 2 (two) times daily.    [provider]  escitalopram  (LEXAPRO ) 20 MG tablet Take 20 mg by mouth every evening.     [provider]  famotidine  (PEPCID ) 20 MG tablet Take 1 tablet (20 mg total) by mouth 2 (two) times daily. 12/20/22   Charlanne Groom, MD  famotidine  (PEPCID ) 20 MG tablet Take 1 tablet (20 mg total) by mouth 2 (two) times daily. 06/16/23   Charlanne Groom, MD  Vitamin D-Vitamin K (VITAMIN K2-VITAMIN D3 PO) Take 1 capsule by mouth daily.    [provider]    ROS: All other systems have been reviewed and were otherwise negative with the exception of those mentioned in the HPI and as above.  Physical Exam: General: Alert, no acute distress Cardiovascular: No pedal edema Respiratory: No cyanosis, no use of accessory musculature GI: No organomegaly, abdomen is soft and non-tender Skin: No lesions in the area of chief complaint Neurologic: Sensation intact distally Psychiatric: Patient is competent for consent with normal mood and affect Lymphatic: No axillary or cervical lymphadenopathy  MUSCULOSKELETAL:  Range of motion of the right shoulder with scapular elevation to about 10-15 degrees.  External rotation is -10.  Internal rotation to T8 with scapular involvement.  Forward flexion of about 80 degrees.  Cuff strength is weak throughout.  Left shoulder demonstrates active forward elevation to 120.  External rotation to 10.    Imaging: Bilateral shoulder x-rays, two views, were reviewed and demonstrate on the right complete loss of sphericity of the humeral head with collapse of the humeral head and erosion and medialization of the glenoid constituting end stage glenohumeral arthritis with associated bone loss.  The left shoulder demonstrates end stage glenohumeral arthritis without significant glenoid bone loss.  However, there is significant osteophytosis.    BMI: Estimated body mass index is 20.9 kg/m as calculated from the following:   Height as of 07/31/23: 5' 3 (1.6 m).   Weight as of 07/31/23: 53.5 kg.  No results found for: ALBUMIN Diabetes: Patient does not have a  diagnosis of diabetes.     Smoking Status:   reports that she has never smoked. She has never used smokeless tobacco.     Assessment: Primary osteoarthritis of right shoulder  Plan: Plan for Procedure(s): ARTHROPLASTY, SHOULDER, TOTAL, REVERSE  The risks benefits and alternatives were discussed with the patient including but not limited to the risks of nonoperative treatment, versus surgical intervention including infection, bleeding, nerve injury,  blood clots, cardiopulmonary complications, morbidity, mortality, among others, and they were willing to proceed.   We additionally specifically discussed risks of axillary nerve injury, infection, periprosthetic fracture, continued pain and longevity of implants  prior to beginning procedure.    Patient will be closely monitored in PACU for medical stabilization and pain control. If found stable in PACU, patient may be discharged home with outpatient follow-up. If any concerns regarding patient's stabilization patient will be admitted for observation after surgery. The patient is planning to be discharged home with outpatient PT.   The patient acknowledged the explanation, agreed to proceed with the plan and consent was signed.   Operative Plan: Right reverse total shoulder arthroplasty Discharge Medications: standard DVT Prophylaxis: aspirin  Physical Therapy: outpatient PT Special Discharge needs: Sling (should bring with her). IceMan   Aleck LOISE Stalling, PA-C  02/03/2024 8:05 AM

## 2024-02-05 ENCOUNTER — Encounter (HOSPITAL_BASED_OUTPATIENT_CLINIC_OR_DEPARTMENT_OTHER): Payer: Self-pay | Admitting: Orthopaedic Surgery

## 2024-02-06 ENCOUNTER — Encounter (HOSPITAL_BASED_OUTPATIENT_CLINIC_OR_DEPARTMENT_OTHER)
Admission: RE | Admit: 2024-02-06 | Discharge: 2024-02-06 | Disposition: A | Discharge status: Home / Self Care | Source: Ambulatory Visit | Attending: Orthopaedic Surgery | Admitting: Orthopaedic Surgery

## 2024-02-06 DIAGNOSIS — Z01818 Encounter for other preprocedural examination: Secondary | ICD-10-CM | POA: Insufficient documentation

## 2024-02-06 LAB — BASIC METABOLIC PANEL WITH GFR
Anion gap: 12 (ref 5–15)
BUN: 22 mg/dL (ref 8–23)
CO2: 24 mmol/L (ref 22–32)
Calcium: 9.5 mg/dL (ref 8.9–10.3)
Chloride: 104 mmol/L (ref 98–111)
Creatinine, Ser: 1 mg/dL (ref 0.44–1.00)
GFR, Estimated: 59 mL/min — ABNORMAL LOW (ref >60)
Glucose, Bld: 100 mg/dL — ABNORMAL HIGH (ref 70–99)
Potassium: 4.4 mmol/L (ref 3.5–5.1)
Sodium: 140 mmol/L (ref 135–145)

## 2024-02-06 LAB — SURGICAL PCR SCREEN
MRSA, PCR: NEGATIVE
Staphylococcus aureus: NEGATIVE

## 2024-02-06 NOTE — Progress Notes (Addendum)
Surgical soap given with instructions, pt verbalized understanding.  Benzoyl peroxide gel given with written instructions, pt verbalized understanding.  

## 2024-02-12 ENCOUNTER — Encounter (HOSPITAL_BASED_OUTPATIENT_CLINIC_OR_DEPARTMENT_OTHER): Payer: Self-pay | Admitting: Orthopaedic Surgery

## 2024-02-12 ENCOUNTER — Ambulatory Visit (HOSPITAL_BASED_OUTPATIENT_CLINIC_OR_DEPARTMENT_OTHER)

## 2024-02-12 ENCOUNTER — Encounter (HOSPITAL_BASED_OUTPATIENT_CLINIC_OR_DEPARTMENT_OTHER)
Admission: RE | Disposition: A | Discharge status: Home / Self Care | Payer: Self-pay | Source: Home / Self Care | Attending: Orthopaedic Surgery

## 2024-02-12 ENCOUNTER — Ambulatory Visit (HOSPITAL_BASED_OUTPATIENT_CLINIC_OR_DEPARTMENT_OTHER)
Admission: RE | Admit: 2024-02-12 | Discharge: 2024-02-12 | Disposition: A | Discharge status: Home / Self Care | Attending: Orthopaedic Surgery | Admitting: Orthopaedic Surgery

## 2024-02-12 ENCOUNTER — Other Ambulatory Visit: Payer: Self-pay

## 2024-02-12 ENCOUNTER — Ambulatory Visit (HOSPITAL_COMMUNITY)

## 2024-02-12 DIAGNOSIS — Z7984 Long term (current) use of oral hypoglycemic drugs: Secondary | ICD-10-CM | POA: Insufficient documentation

## 2024-02-12 DIAGNOSIS — G4730 Sleep apnea, unspecified: Secondary | ICD-10-CM | POA: Diagnosis not present

## 2024-02-12 DIAGNOSIS — M19011 Primary osteoarthritis, right shoulder: Secondary | ICD-10-CM | POA: Diagnosis not present

## 2024-02-12 DIAGNOSIS — Z01818 Encounter for other preprocedural examination: Secondary | ICD-10-CM

## 2024-02-12 DIAGNOSIS — E119 Type 2 diabetes mellitus without complications: Secondary | ICD-10-CM | POA: Diagnosis not present

## 2024-02-12 DIAGNOSIS — I10 Essential (primary) hypertension: Secondary | ICD-10-CM | POA: Diagnosis not present

## 2024-02-12 DIAGNOSIS — F419 Anxiety disorder, unspecified: Secondary | ICD-10-CM | POA: Diagnosis not present

## 2024-02-12 DIAGNOSIS — K219 Gastro-esophageal reflux disease without esophagitis: Secondary | ICD-10-CM | POA: Diagnosis not present

## 2024-02-12 SURGERY — ARTHROPLASTY, SHOULDER, TOTAL, REVERSE
Anesthesia: Regional | Site: Shoulder | Laterality: Right

## 2024-02-12 MED ORDER — FENTANYL CITRATE (PF) 100 MCG/2ML IJ SOLN
100.0000 ug | Freq: Once | INTRAMUSCULAR | Status: AC
  Administered 2024-02-12: 100 ug via INTRAVENOUS

## 2024-02-12 MED ORDER — PHENYLEPHRINE HCL-NACL 20-0.9 MG/250ML-% IV SOLN
INTRAVENOUS | Status: DC | PRN
  Administered 2024-02-12: 40 ug/min via INTRAVENOUS

## 2024-02-12 MED ORDER — FENTANYL CITRATE (PF) 100 MCG/2ML IJ SOLN
INTRAMUSCULAR | Status: AC
  Filled 2024-02-12: qty 2

## 2024-02-12 MED ORDER — CEFAZOLIN SODIUM-DEXTROSE 2-4 GM/100ML-% IV SOLN
2.0000 g | INTRAVENOUS | Status: AC
  Administered 2024-02-12: 2 g via INTRAVENOUS

## 2024-02-12 MED ORDER — ONDANSETRON HCL 4 MG/2ML IJ SOLN
INTRAMUSCULAR | Status: AC
  Filled 2024-02-12: qty 2

## 2024-02-12 MED ORDER — MIDAZOLAM HCL 2 MG/2ML IJ SOLN
INTRAMUSCULAR | Status: AC
  Filled 2024-02-12: qty 2

## 2024-02-12 MED ORDER — OXYCODONE HCL 5 MG PO TABS: ORAL_TABLET | ORAL | 0 refills | Status: AC

## 2024-02-12 MED ORDER — OXYCODONE HCL 5 MG PO TABS: 5.0000 mg | ORAL_TABLET | Freq: Once | ORAL | Status: DC | PRN

## 2024-02-12 MED ORDER — PROPOFOL 10 MG/ML IV BOLUS
INTRAVENOUS | Status: AC
  Filled 2024-02-12: qty 20

## 2024-02-12 MED ORDER — PHENYLEPHRINE HCL (PRESSORS) 10 MG/ML IV SOLN
INTRAVENOUS | Status: DC | PRN
  Administered 2024-02-12: 320 ug via INTRAVENOUS

## 2024-02-12 MED ORDER — VASOPRESSIN 20 UNIT/ML IV SOLN
INTRAVENOUS | Status: AC
  Filled 2024-02-12: qty 1

## 2024-02-12 MED ORDER — ASPIRIN 81 MG PO CHEW: 81.0000 mg | CHEWABLE_TABLET | Freq: Two times a day (BID) | ORAL | 0 refills | Status: AC

## 2024-02-12 MED ORDER — BUPIVACAINE HCL (PF) 0.5 % IJ SOLN
INTRAMUSCULAR | Status: DC | PRN
  Administered 2024-02-12: 10 mL via PERINEURAL

## 2024-02-12 MED ORDER — VANCOMYCIN HCL 1000 MG IV SOLR
INTRAVENOUS | Status: DC | PRN
  Administered 2024-02-12: 1000 mg

## 2024-02-12 MED ORDER — OXYCODONE HCL 5 MG/5ML PO SOLN: 5.0000 mg | Freq: Once | ORAL | Status: DC | PRN

## 2024-02-12 MED ORDER — ACETAMINOPHEN 500 MG PO TABS: 1000.0000 mg | ORAL_TABLET | Freq: Three times a day (TID) | ORAL | 0 refills | Status: AC

## 2024-02-12 MED ORDER — SUGAMMADEX SODIUM 200 MG/2ML IV SOLN
INTRAVENOUS | Status: DC | PRN
Start: 2024-02-12 — End: 2024-02-12
  Administered 2024-02-12: 225 mg via INTRAVENOUS

## 2024-02-12 MED ORDER — GABAPENTIN 300 MG PO CAPS
300.0000 mg | ORAL_CAPSULE | Freq: Once | ORAL | Status: AC
  Administered 2024-02-12: 300 mg via ORAL

## 2024-02-12 MED ORDER — LIDOCAINE 2% (20 MG/ML) 5 ML SYRINGE
INTRAMUSCULAR | Status: AC
  Filled 2024-02-12: qty 5

## 2024-02-12 MED ORDER — ONDANSETRON 4 MG PO TBDP
4.0000 mg | ORAL_TABLET | Freq: Once | ORAL | Status: AC
  Administered 2024-02-12: 4 mg via ORAL

## 2024-02-12 MED ORDER — ACETAMINOPHEN 500 MG PO TABS
ORAL_TABLET | ORAL | Status: AC
  Filled 2024-02-12: qty 2

## 2024-02-12 MED ORDER — FENTANYL CITRATE (PF) 100 MCG/2ML IJ SOLN: 25.0000 ug | INTRAMUSCULAR | Status: DC | PRN

## 2024-02-12 MED ORDER — LIDOCAINE HCL (CARDIAC) PF 100 MG/5ML IV SOSY
PREFILLED_SYRINGE | INTRAVENOUS | Status: DC | PRN
Start: 2024-02-12 — End: 2024-02-12
  Administered 2024-02-12: 40 mg via INTRAVENOUS

## 2024-02-12 MED ORDER — ONDANSETRON HCL 4 MG/2ML IJ SOLN
INTRAMUSCULAR | Status: DC | PRN
  Administered 2024-02-12: 4 mg via INTRAVENOUS

## 2024-02-12 MED ORDER — ROCURONIUM BROMIDE 10 MG/ML (PF) SYRINGE
PREFILLED_SYRINGE | INTRAVENOUS | Status: AC
  Filled 2024-02-12: qty 10

## 2024-02-12 MED ORDER — FENTANYL CITRATE (PF) 100 MCG/2ML IJ SOLN
INTRAMUSCULAR | Status: AC
Start: 2024-02-12 — End: 2024-02-12
  Filled 2024-02-12: qty 2

## 2024-02-12 MED ORDER — LACTATED RINGERS IV SOLN: INTRAVENOUS | Status: DC

## 2024-02-12 MED ORDER — DROPERIDOL 2.5 MG/ML IJ SOLN: 0.6250 mg | Freq: Once | INTRAMUSCULAR | Status: DC | PRN

## 2024-02-12 MED ORDER — ACETAMINOPHEN 10 MG/ML IV SOLN: 1000.0000 mg | Freq: Once | INTRAVENOUS | Status: DC | PRN

## 2024-02-12 MED ORDER — ACETAMINOPHEN 500 MG PO TABS
1000.0000 mg | ORAL_TABLET | Freq: Once | ORAL | Status: AC
  Administered 2024-02-12: 1000 mg via ORAL

## 2024-02-12 MED ORDER — CELECOXIB 100 MG PO CAPS: 100.0000 mg | ORAL_CAPSULE | Freq: Two times a day (BID) | ORAL | 0 refills | Status: AC

## 2024-02-12 MED ORDER — GABAPENTIN 300 MG PO CAPS
ORAL_CAPSULE | ORAL | Status: AC
  Filled 2024-02-12: qty 1

## 2024-02-12 MED ORDER — BUPIVACAINE LIPOSOME 1.3 % IJ SUSP
INTRAMUSCULAR | Status: DC | PRN
  Administered 2024-02-12: 10 mL via PERINEURAL

## 2024-02-12 MED ORDER — ALBUMIN HUMAN 5 % IV SOLN: INTRAVENOUS | Status: DC | PRN

## 2024-02-12 MED ORDER — TRANEXAMIC ACID-NACL 1000-0.7 MG/100ML-% IV SOLN
1000.0000 mg | INTRAVENOUS | Status: AC
  Administered 2024-02-12: 1000 mg via INTRAVENOUS

## 2024-02-12 MED ORDER — ONDANSETRON HCL 4 MG PO TABS: 4.0000 mg | ORAL_TABLET | Freq: Three times a day (TID) | ORAL | 0 refills | Status: AC | PRN

## 2024-02-12 MED ORDER — CEFAZOLIN SODIUM-DEXTROSE 2-4 GM/100ML-% IV SOLN
INTRAVENOUS | Status: AC
  Filled 2024-02-12: qty 100

## 2024-02-12 MED ORDER — PROPOFOL 10 MG/ML IV BOLUS
INTRAVENOUS | Status: DC | PRN
  Administered 2024-02-12: 100 mg via INTRAVENOUS

## 2024-02-12 MED ORDER — 0.9 % SODIUM CHLORIDE (POUR BTL) OPTIME
TOPICAL | Status: DC | PRN
  Administered 2024-02-12: 1000 mL

## 2024-02-12 MED ORDER — DEXAMETHASONE SODIUM PHOSPHATE 10 MG/ML IJ SOLN
INTRAMUSCULAR | Status: AC
  Filled 2024-02-12: qty 1

## 2024-02-12 MED ORDER — DEXAMETHASONE SODIUM PHOSPHATE 4 MG/ML IJ SOLN
INTRAMUSCULAR | Status: DC | PRN
  Administered 2024-02-12: 5 mg via INTRAVENOUS

## 2024-02-12 MED ORDER — TRANEXAMIC ACID-NACL 1000-0.7 MG/100ML-% IV SOLN
INTRAVENOUS | Status: AC
  Filled 2024-02-12: qty 100

## 2024-02-12 MED ORDER — ONDANSETRON 4 MG PO TBDP
ORAL_TABLET | ORAL | Status: AC
  Filled 2024-02-12: qty 1

## 2024-02-12 MED ORDER — ROCURONIUM BROMIDE 100 MG/10ML IV SOLN
INTRAVENOUS | Status: DC | PRN
  Administered 2024-02-12: 50 mg via INTRAVENOUS

## 2024-02-12 SURGICAL SUPPLY — 55 items
BIT DRILL 3.2 PERIPHERAL SCREW (BIT) IMPLANT
BLADE SAW SGTL 73X25 THK (BLADE) ×1 IMPLANT
BLADE SURG 10 STRL SS (BLADE) IMPLANT
BLADE SURG 15 STRL LF DISP TIS (BLADE) IMPLANT
BRUSH SCRUB EZ PLAIN DRY (MISCELLANEOUS) ×1 IMPLANT
CHLORAPREP W/TINT 26 (MISCELLANEOUS) ×1 IMPLANT
CLSR STERI-STRIP ANTIMIC 1/2X4 (GAUZE/BANDAGES/DRESSINGS) ×1 IMPLANT
COMPONENT GLEND PRIM REV 25X25 (Shoulder) IMPLANT
COOLER ICEMAN CLASSIC (MISCELLANEOUS) ×1 IMPLANT
COVER BACK TABLE 60X90IN (DRAPES) ×1 IMPLANT
COVER MAYO STAND STRL (DRAPES) ×1 IMPLANT
DRAPE IMP U-DRAPE 54X76 (DRAPES) IMPLANT
DRAPE INCISE IOBAN 66X45 STRL (DRAPES) ×1 IMPLANT
DRAPE POUCH INSTRU U-SHP 10X18 (DRAPES) ×1 IMPLANT
DRAPE U-SHAPE 76X120 STRL (DRAPES) ×2 IMPLANT
DRSG AQUACEL AG ADV 3.5X 6 (GAUZE/BANDAGES/DRESSINGS) ×1 IMPLANT
ELECTRODE BLDE 4.0 EZ CLN MEGD (MISCELLANEOUS) ×1 IMPLANT
ELECTRODE REM PT RTRN 9FT ADLT (ELECTROSURGICAL) ×1 IMPLANT
FACESHIELD WRAPAROUND OR TEAM (MASK) ×2 IMPLANT
GAUZE XEROFORM 1X8 LF (GAUZE/BANDAGES/DRESSINGS) IMPLANT
GLENOSPHERE REV SHOULDER 36 (Joint) IMPLANT
GLOVE BIO SURGEON STRL SZ 6.5 (GLOVE) ×2 IMPLANT
GLOVE BIOGEL PI IND STRL 6.5 (GLOVE) ×1 IMPLANT
GLOVE BIOGEL PI IND STRL 8 (GLOVE) ×1 IMPLANT
GLOVE ECLIPSE 8.0 STRL XLNG CF (GLOVE) ×2 IMPLANT
GOWN STRL REUS W/ TWL LRG LVL3 (GOWN DISPOSABLE) ×2 IMPLANT
GOWN STRL REUS W/TWL XL LVL3 (GOWN DISPOSABLE) ×1 IMPLANT
GUIDE GLENOID PRIM REV 25 DISP (ORTHOPEDIC DISPOSABLE SUPPLIES) IMPLANT
GUIDEWIRE GLENOID 2.5X220 (WIRE) IMPLANT
INSERT REVERSED HUMERAL SIZE 1 (Orthopedic Implant) IMPLANT
KIT STABILIZATION SHOULDER (MISCELLANEOUS) ×1 IMPLANT
MANIFOLD NEPTUNE II (INSTRUMENTS) ×1 IMPLANT
MODEL PRIM REV SHLD DISP (ORTHOPEDIC DISPOSABLE SUPPLIES) IMPLANT
PACK BASIN DAY SURGERY FS (CUSTOM PROCEDURE TRAY) ×1 IMPLANT
PACK SHOULDER (CUSTOM PROCEDURE TRAY) ×1 IMPLANT
PAD COLD SHLDR WRAP-ON (PAD) ×1 IMPLANT
PIN GUIDE 3X75 SHOULDER (PIN) IMPLANT
RESTRAINT HEAD UNIVERSAL NS (MISCELLANEOUS) ×1 IMPLANT
SCREW 5.5X22 (Screw) IMPLANT
SCREW 5.5X26 (Screw) IMPLANT
SET HNDPC FAN SPRY TIP SCT (DISPOSABLE) ×1 IMPLANT
SHEET MEDIUM DRAPE 40X70 STRL (DRAPES) ×1 IMPLANT
SLEEVE SCD COMPRESS KNEE MED (STOCKING) ×1 IMPLANT
SPIKE FLUID TRANSFER (MISCELLANEOUS) IMPLANT
SPONGE T-LAP 18X18 ~~LOC~~+RFID (SPONGE) ×1 IMPLANT
STEM HUMERAL PLUS LONG SZ2 (Orthopedic Implant) IMPLANT
SUT ETHIBOND 2 V 37 (SUTURE) ×1 IMPLANT
SUT ETHIBOND NAB CT1 #1 30IN (SUTURE) ×1 IMPLANT
SUT ETHILON 3 0 PS 1 (SUTURE) IMPLANT
SUT MNCRL AB 4-0 PS2 18 (SUTURE) ×1 IMPLANT
SUT VIC AB 0 CT1 27XBRD ANBCTR (SUTURE) IMPLANT
SUT VIC AB 3-0 SH 27X BRD (SUTURE) ×1 IMPLANT
SUTURE FIBERWR #5 38 CONV NDL (SUTURE) ×2 IMPLANT
TOWEL GREEN STERILE FF (TOWEL DISPOSABLE) ×3 IMPLANT
TUBE SUCTION HIGH CAP CLEAR NV (SUCTIONS) ×1 IMPLANT

## 2024-02-12 NOTE — Transfer of Care (Signed)
 Immediate Anesthesia Transfer of Care Note  Patient: Pamela Wheeler  Procedure(s) Performed: ARTHROPLASTY, SHOULDER, TOTAL, REVERSE (Right: Shoulder)  Patient Location: PACU  Anesthesia Type:General and Regional  Level of Consciousness: awake, alert , and patient cooperative  Airway & Oxygen Therapy: Patient Spontanous Breathing and Patient connected to face mask oxygen  Post-op Assessment: Report given to RN and Post -op Vital signs reviewed and stable  Post vital signs: Reviewed and stable  Last Vitals:  Vitals Value Taken Time  BP 153/69 02/12/24 10:30  Temp    Pulse 78 02/12/24 10:32  Resp 16 02/12/24 10:32  SpO2 94 % 02/12/24 10:32  Vitals shown include unfiled device data.  Last Pain:  Vitals:   02/12/24 0725  TempSrc: Tympanic  PainSc: 0-No pain      Patients Stated Pain Goal: 7 (02/12/24 0725)  Complications: No notable events documented.

## 2024-02-12 NOTE — Anesthesia Procedure Notes (Signed)
 Procedure Name: Intubation Date/Time: 02/12/2024 9:27 AM  Performed by: Burnard Rosaline HERO, CRNAPre-anesthesia Checklist: Patient identified, Emergency Drugs available, Suction available and Patient being monitored Patient Re-evaluated:Patient Re-evaluated prior to induction Oxygen Delivery Method: Circle system utilized Preoxygenation: Pre-oxygenation with 100% oxygen Induction Type: IV induction Ventilation: Mask ventilation without difficulty Laryngoscope Size: Mac and 4 Grade View: Grade I Tube type: Oral Tube size: 7.0 mm Number of attempts: 1 Airway Equipment and Method: Stylet and Oral airway Placement Confirmation: ETT inserted through vocal cords under direct vision, positive ETCO2, breath sounds checked- equal and bilateral and CO2 detector Secured at: 23 cm Tube secured with: Tape Dental Injury: Teeth and Oropharynx as per pre-operative assessment  Comments: Easy mask and easy intubation, lips and teeth as preop

## 2024-02-12 NOTE — Anesthesia Preprocedure Evaluation (Addendum)
 Anesthesia Evaluation  Patient identified by MRN, date of birth, ID band Patient awake    Reviewed: Allergy & Precautions, NPO status , Patient's Chart, lab work & pertinent test results  History of Anesthesia Complications (+) PONV and history of anesthetic complications  Airway Mallampati: II  TM Distance: >3 FB Neck ROM: Full    Dental no notable dental hx.    Pulmonary sleep apnea    Pulmonary exam normal breath sounds clear to auscultation       Cardiovascular hypertension, (-) Past MI Normal cardiovascular exam Rhythm:Regular Rate:Normal     Neuro/Psych  Headaches PSYCHIATRIC DISORDERS Anxiety        GI/Hepatic Neg liver ROS,GERD  ,,  Endo/Other  negative endocrine ROS    Renal/GU negative Renal ROS     Musculoskeletal  (+) Arthritis ,    Abdominal   Peds  Hematology negative hematology ROS (+)   Anesthesia Other Findings   Reproductive/Obstetrics                              Anesthesia Physical Anesthesia Plan  ASA: 2  Anesthesia Plan: General and Regional   Post-op Pain Management: Regional block* and Tylenol  PO (pre-op)*   Induction: Intravenous  PONV Risk Score and Plan: 4 or greater and Ondansetron , Dexamethasone  and Treatment may vary due to age or medical condition  Airway Management Planned: Oral ETT  Additional Equipment: None  Intra-op Plan:   Post-operative Plan: Extubation in OR  Informed Consent: I have reviewed the patients History and Physical, chart, labs and discussed the procedure including the risks, benefits and alternatives for the proposed anesthesia with the patient or authorized representative who has indicated his/her understanding and acceptance.       Plan Discussed with: CRNA  Anesthesia Plan Comments:          Anesthesia Quick Evaluation

## 2024-02-12 NOTE — Anesthesia Procedure Notes (Signed)
 Anesthesia Regional Block: Interscalene brachial plexus block   Pre-Anesthetic Checklist: , timeout performed,  Correct Patient, Correct Site, Correct Laterality,  Correct Procedure, Correct Position, site marked,  Risks and benefits discussed,  Surgical consent,  Pre-op evaluation,  At surgeon's request and post-op pain management  Laterality: Right  Prep: chloraprep       Needles:  Injection technique: Single-shot  Needle Type: Echogenic Stimulator Needle     Needle Length: 9cm  Needle Gauge: 21     Additional Needles:   Procedures:,,,, ultrasound used (permanent image in chart),,    Narrative:  Start time: 02/12/2024 8:15 AM End time: 02/12/2024 8:20 AM Injection made incrementally with aspirations every 5 mL.  Performed by: Personally  Anesthesiologist: Erma Thom SAUNDERS, MD  Additional Notes: Discussed risks and benefits of the nerve block in detail, including but not limited vascular injury, permanent nerve damage and infection.   Patient tolerated the procedure well. Local anesthetic introduced in an incremental fashion under minimal resistance after negative aspirations. No paresthesias were elicited. After completion of the procedure, no acute issues were identified and patient continued to be monitored by RN.

## 2024-02-12 NOTE — Discharge Instructions (Addendum)
 Bonner Hair MD, MPH Aleck Stalling, PA-C [facility] [address], [address] [phone] (tel)   [phone] (fax)   POST-OPERATIVE INSTRUCTIONS - TOTAL SHOULDER REPLACEMENT    WOUND CARE You may leave the operative dressing in place until your follow-up appointment. KEEP THE INCISIONS CLEAN AND DRY. There may be a small amount of fluid/bleeding leaking at the surgical site. This is normal after surgery.  If it fills with liquid or blood please call us  immediately to change it for you. Use the provided ice machine or Ice packs as often as possible for the first 3-4 days, then as needed for pain relief.   Keep a layer of cloth or a shirt between your skin and the cooling unit to prevent frost bite as it can get very cold.  SHOWERING: - You may shower on Post-Op Day #2.  - The dressing is water resistant but do not scrub it as it may start to peel up.   - You may remove the sling for showering - Gently pat the area dry.  - Do not soak the shoulder in water.  - Do not go swimming in the pool or ocean until your incision has completely healed (about 4-6 weeks after surgery) - KEEP THE INCISIONS CLEAN AND DRY.  EXERCISES Wear the sling at all times  You may remove the sling for showering, but keep the arm across the chest or in a secondary sling.    Accidental/Purposeful External Rotation and shoulder flexion (reaching behind you) is to be avoided at all costs for the first month. It is ok to come out of your sling if your are sitting and have assistance for eating.   Do not lift anything heavier than 1 pound until we discuss it further in clinic.  It is normal for your fingers/hand to become more swollen after surgery and discolored from bruising.   This will resolve over the first few weeks usually after surgery. Please continue to ambulate and do not stay sitting or lying for too long.  Perform foot and wrist pumps to assist in circulation.  PHYSICAL  THERAPY - You will begin physical therapy soon after surgery (unless otherwise specified) - Please call to set up an appointment, if you do not already have one  - Let our office if there are any issues with scheduling your therapy  - You have a physical therapy appointment scheduled at [facility] (across the hall from our office) on 8/18   REGIONAL ANESTHESIA (NERVE BLOCKS) The anesthesia team may have performed a nerve block for you this is a great tool used to minimize pain.   The block may start wearing off overnight (between 8-24 hours postop) When the block wears off, your pain may go from nearly zero to the pain you would have had postop without the block. This is an abrupt transition but nothing dangerous is happening.   This can be a challenging period but utilize your as needed pain medications to try and manage this period. We suggest you use the pain medication the first night prior to going to bed, to ease this transition.  You may take an extra dose of narcotic when this happens if needed   POST-OP MEDICATIONS- Multimodal approach to pain control In general your pain will be controlled with a combination of substances.  Prescriptions unless otherwise discussed are electronically sent to your pharmacy.  This is a carefully made plan we use to minimize narcotic use.  Celebrex  - Anti-inflammatory medication taken on a scheduled basis Acetaminophen  - Non-narcotic pain medicine taken on a scheduled basis  Oxycodone  - This is a strong narcotic, to be used only on an "as needed" basis for SEVERE pain. Aspirin  81mg  - This medicine is used to minimize the risk of blood clots after surgery. Zofran  -  take as needed for nausea   FOLLOW-UP If you develop a Fever (>101.5), Redness or Drainage from the surgical incision site, please call our office to arrange for an evaluation. Please call the office to schedule a follow-up appointment for a wound check, 7-10 days post-operatively.  IF  YOU HAVE ANY QUESTIONS, PLEASE FEEL FREE TO CALL OUR OFFICE.  HELPFUL INFORMATION  Your arm will be in a sling following surgery. You will be in this sling for the next 4 weeks.   You may be more comfortable sleeping in a semi-seated position the first few nights following surgery.  Keep a pillow propped under the elbow and forearm for comfort.  If you have a recliner type of chair it might be beneficial.  If not that is fine too, but it would be helpful to sleep propped up with pillows behind your operated shoulder as well under your elbow and forearm.  This will reduce pulling on the suture lines.  When dressing, put your operative arm in the sleeve first.  When getting undressed, take your operative arm out last.  Loose fitting, button-down shirts are recommended.  In most states it is against the law to drive while your arm is in a sling. And certainly against the law to drive while taking narcotics.  You may return to work/school in the next couple of days when you feel up to it. Desk work and typing in the sling is fine.  We suggest you use the pain medication the first night prior to going to bed, in order to ease any pain when the anesthesia wears off. You should avoid taking pain medications on an empty stomach as it will make you nauseous.  You should wean off your narcotic medicines as soon as you are able.     Most patients will be off narcotics before their first postop appointment.   Do not drink alcoholic beverages or take illicit drugs when taking pain medications.  Pain medication may make you constipated.  Below are a few solutions to try in this order: Decrease the amount of pain medication if you aren't having pain. Drink lots of decaffeinated fluids. Drink prune juice and/or each dried prunes  If the first 3 don't work start with additional solutions Take Colace - an over-the-counter stool softener Take Senokot - an over-the-counter laxative Take Miralax - a stronger  over-the-counter laxative   Dental Antibiotics:  We require dental prophylaxis for 2 years after a shoulder replacement  Contact your surgeon for an antibiotic prescription, prior to your dental procedure.   For more information including helpful videos and documents visit our website:   [email or web address]     No tylenol  until 1:30pm    Post Anesthesia Home Care Instructions  Activity: Get plenty of rest for the remainder of the day. A responsible individual must stay with you for 24 hours following the procedure.  For the next 24 hours, DO NOT: -Drive a car -Advertising copywriter -Drink alcoholic beverages -Take any medication unless instructed by your physician -Make any legal decisions or sign important papers.  Meals: Start with liquid foods such as gelatin or soup. Progress to regular foods  as tolerated. Avoid greasy, spicy, heavy foods. If nausea and/or vomiting occur, drink only clear liquids until the nausea and/or vomiting subsides. Call your physician if vomiting continues.  Special Instructions/Symptoms: Your throat may feel dry or sore from the anesthesia or the breathing tube placed in your throat during surgery. If this causes discomfort, gargle with warm salt water. The discomfort should disappear within 24 hours.  If you had a scopolamine patch placed behind your ear for the management of post- operative nausea and/or vomiting:  1. The medication in the patch is effective for 72 hours, after which it should be removed.  Wrap patch in a tissue and discard in the trash. Wash hands thoroughly with soap and water. 2. You may remove the patch earlier than 72 hours if you experience unpleasant side effects which may include dry mouth, dizziness or visual disturbances. 3. Avoid touching the patch. Wash your hands with soap and water after contact with the patch.   Regional Anesthesia Blocks  1. You may not be able to move or feel the  blocked extremity after a regional anesthetic block. This may last may last from 3-48 hours after placement, but it will go away. The length of time depends on the medication injected and your individual response to the medication. As the nerves start to wake up, you may experience tingling as the movement and feeling returns to your extremity. If the numbness and inability to move your extremity has not gone away after 48 hours, please call your surgeon.   2. The extremity that is blocked will need to be protected until the numbness is gone and the strength has returned. Because you cannot feel it, you will need to take extra care to avoid injury. Because it may be weak, you may have difficulty moving it or using it. You may not know what position it is in without looking at it while the block is in effect.  3. For blocks in the legs and feet, returning to weight bearing and walking needs to be done carefully. You will need to wait until the numbness is entirely gone and the strength has returned. You should be able to move your leg and foot normally before you try and bear weight or walk. You will need someone to be with you when you first try to ensure you do not fall and possibly risk injury.  4. Bruising and tenderness at the needle site are common side effects and will resolve in a few days.  5. Persistent numbness or new problems with movement should be communicated to the surgeon or the [facility] [phone] [facility] [phone]).Information for Discharge Teaching: EXPAREL  (bupivacaine  liposome injectable suspension)   Pain relief is important to your recovery. The goal is to control your pain so you can move easier and return to your normal activities as soon as possible after your procedure. Your physician may use several types of medicines to manage pain, swelling, and more.  Your surgeon or anesthesiologist gave you EXPAREL (bupivacaine ) to help control  your pain after surgery.  EXPAREL  is a local anesthetic designed to release slowly over an extended period of time to provide pain relief by numbing the tissue around the surgical site. EXPAREL  is designed to release pain medication over time and can control pain for up to 72 hours. Depending on how you respond to EXPAREL , you may require less pain medication during your recovery. EXPAREL  can help reduce or eliminate the need for opioids  during the first few days after surgery when pain relief is needed the most. EXPAREL  is not an opioid and is not addictive. It does not cause sleepiness or sedation.   Important! A teal colored band has been placed on your arm with the date, time and amount of EXPAREL  you have received. Please leave this armband in place for the full 96 hours following administration, and then you may remove the band. If you return to the hospital for any reason within 96 hours following the administration of EXPAREL , the armband provides important information that your health care providers to know, and alerts them that you have received this anesthetic.    Possible side effects of EXPAREL : Temporary loss of sensation or ability to move in the area where medication was injected. Nausea, vomiting, constipation Rarely, numbness and tingling in your mouth or lips, lightheadedness, or anxiety may occur. Call your doctor right away if you think you may be experiencing any of these sensations, or if you have other questions regarding possible side effects.  Follow all other discharge instructions given to you by your surgeon or nurse. Eat a healthy diet and drink plenty of water or other fluids.

## 2024-02-12 NOTE — Interval H&P Note (Signed)
 All questions answered, patient wants to proceed with procedure. ? ?

## 2024-02-12 NOTE — Progress Notes (Signed)
 Assisted Dr. Lyn Henri with right, interscalene , ultrasound guided block. Side rails up, monitors on throughout procedure. See vital signs in flow sheet. Tolerated Procedure well.

## 2024-02-12 NOTE — Op Note (Signed)
 Orthopaedic Surgery Operative Note (CSN: [id number])  Pamela Wheeler  1951/01/15 Date of Surgery: 02/12/2024   Diagnoses:  Primary osteoarthritis of right shoulder  Procedure: Right reverse total Shoulder Arthroplasty with custom glenoid implant   Operative Finding Successful completion of planned procedure.  Patient had significant erosion of the head as well as the glenoid.  Her custom implant fit well and we had good baseplate seating.  Good bone quality on the glenoid side however the humeral side was extremely poor bone quality.  She will meet the therapist next week however I will avoid significant range of motion of the shoulder until after a month.  Post-operative plan: The patient will be NWB in sling.  The patient will be will be discharged from PACU if continues to be stable as was plan prior to surgery.  DVT prophylaxis Aspirin  81 mg twice daily for 6 weeks.  Pain control with PRN pain medication preferring oral medicines.  Follow up plan will be scheduled in approximately 7 days for incision check and XR.  Physical therapy to start immediately but they will defer range of motion for the first month.  Implants: Tornier perform humeral size 2 long stem, 0 polyethylene, 36 standard glenosphere with a custom Tornier patient-specific glenoid, 4 peripheral screws  Post-Op Diagnosis: Same Surgeons:Primary: Cristy Bonner DASEN, MD Assistants:Angela Shona, RNFA Location: [facility] OR ROOM 1 Anesthesia: General with Exparel  Interscalene Antibiotics: Ancef  2g preop, Vancomycin  1000mg  locally Tourniquet time: None Estimated Blood Loss: 100 Complications: None Specimens: None Implants: Implant Name Type Inv. Item Serial No. Manufacturer Lot No. LRB No. Used Action  COMPONENT GLEND PRIM REV 25X25 - [id number] Shoulder COMPONENT GLEND PRIM REV 25X25  [employer] [id number] Right 1 Implanted  GLENOSPHERE REV SHOULDER 36 - [id number] Joint GLENOSPHERE REV SHOULDER 36 [id number] [employer]  Right 1  Implanted  SCREW 5.5X26 - [id number] Screw SCREW 5.5X26  [employer] ON STERILE TRAY Right 1 Implanted  SCREW 5.5X22 - [id number] Screw SCREW 5.5X22  [employer] ON STERILE TRAY Right 3 Implanted  INSERT REVERSED HUMERAL SIZE 1 - [id number] Orthopedic Implant INSERT REVERSED HUMERAL SIZE 1 [id number] [employer]  Right 1 Implanted  STEM HUMERAL PLUS LONG SZ2 - [id number] Orthopedic Implant STEM HUMERAL PLUS LONG SZ2 [id number] [employer]  Right 1 Implanted    Indications for Surgery:   Pamela Wheeler is a 73 y.o. female with end-stage glenohumeral arthritis and a significant bony erosion of the glenoid.  Benefits and risks of operative and nonoperative management were discussed prior to surgery with patient/guardian(s) and informed consent form was completed.  Infection and need for further surgery were discussed as was prosthetic stability and cuff issues.  We additionally specifically discussed risks of axillary nerve injury, infection, periprosthetic fracture, continued pain and longevity of implants prior to beginning procedure.      Procedure:   The patient was identified in the preoperative holding area where the surgical site was marked. Block placed by anesthesia with exparel .  The patient was taken to the OR where a procedural timeout was called and the above noted anesthesia was induced.  The patient was positioned beachchair on allen table with spider arm positioner.  Preoperative antibiotics were dosed.  The patient's right shoulder was prepped and draped in the usual sterile fashion.  A second preoperative timeout was called.       Standard deltopectoral approach was performed with a #10 blade. We dissected down to the subcutaneous tissues and the cephalic vein was taken laterally with  the deltoid. Clavipectoral fascia was incised in line with the incision. Deep retractors were placed. The long of the biceps tendon was identified and there was significant tenosynovitis present.   Tenodesis was performed to the pectoralis tendon with #2 Ethibond. The remaining biceps was followed up into the rotator interval where it was released.   The subscapularis was taken down in a full thickness layer with capsule along the humeral neck extending inferiorly around the humeral head. We continued releasing the capsule directly off of the osteophytes inferiorly all the way around the corner. This allowed us  to dislocate the humeral head.   The humeral head had evidence of severe osteoarthritic wear with full-thickness cartilage loss and exposed subchondral bone. There was significant flattening of the humeral head.   The rotator cuff was carefully examined and noted to be irreperably torn.  The decision was confirmed that a reverse total shoulder was indicated for this patient.  There were osteophytes along the inferior humeral neck. The osteophytes were removed with an osteotome and a rongeur.  Osteophytes were removed with a rongeur and an osteotome and the anatomic neck was well visualized.     A humeral cutting guide was used extra medullary with a pin to help control version. The version was set at 20 of retroversion. Humeral osteotomy was performed with an oscillating saw. The head fragment was passed off the back table.  A cut protector plate was placed.  The subscapularis was again identified and immediately we took care to palpate the axillary nerve anteriorly and verify its position with gentle palpation as well as the tug test.  We then released the SGHL with bovie cautery prior to placing a curved mayo at the junction of the anterior glenoid well above the axillary nerve and bluntly dissecting the subscapularis from the capsule.  We then carefully protected the axillary nerve as we gently released the inferior capsule to fully mobilize the subscapularis.  An anterior deltoid retractor was then placed as well as a small Hohmann retractor superiorly.   The remaining labrum was  removed circumferentially taking great care not to disrupt the posterior capsule.   At this point we felt based on blueprint templating that a patient-specific implant was necessary.  We cleared the peripheral soft tissues and used a scraping device to get down to subchondral bone.  At that point we used the three-dimensional guides to adequately recreate the version.  We used our custom guide and placed a directly on the glenoid.  We placed our center pin as well as our derotational pin.  Once we confirmed appropriate position we reamed the center pin to the above length.  We then prepared our final implant after irrigating and placed it with good secure fit.  There was great bony apposition of the implant and native bone.  We placed our 2 locking and 2 nonlocking screws as this typical for patient-specific instrumentation.   Next a 36 mm glenosphere was selected and impacted onto the baseplate. The center screw was tightened.  We turned attention back to the humeral side. The cut protector was removed.  We used the perform humeral sizing block to select the appropriate size which for this patient was a 2.  We then placed our center pin and reamed over it concentrically obtaining appropriate inset.  We then used our lateralizing chisel to prepare the lateral aspect of the humerus.  Using this trial implant we trialed multiple polyethylene sizes settling on a 0 which provided good stability  and range of motion without excess soft tissue tension. The offset was dialed in to match the normal anatomy. The shoulder was trialed.  There was good ROM in all planes and the shoulder was stable with no inferior translation.  The real humeral implants were opened after again confirming sizes.  The trial was removed. #5 Fiberwire x4 sutures passed through the humeral neck for subscap repair. The humeral component was press-fit obtaining a secure fit. The joint was reduced and thoroughly irrigated with pulsatile  lavage. Subscap was repaired back with #5 Fiberwire sutures through bone tunnels. Hemostasis was obtained. The deltopectoral interval was reapproximated with #1 Ethibond. The subcutaneous tissues were closed with 2-0 Vicryl and the skin was closed with running monocryl.    The wounds were cleaned and dried and an Aquacel dressing was placed. The drapes taken down. The arm was placed into sling with abduction pillow. Patient was awakened, extubated, and transferred to the recovery room in stable condition. There were no intraoperative complications. The sponge, needle, and attention counts were correct at the end of the case.

## 2024-02-12 NOTE — Anesthesia Postprocedure Evaluation (Signed)
 Anesthesia Post Note  Patient: Pamela Wheeler  Procedure(s) Performed: ARTHROPLASTY, SHOULDER, TOTAL, REVERSE (Right: Shoulder)     Patient location during evaluation: PACU Anesthesia Type: Regional and General Level of consciousness: awake and alert Pain management: pain level controlled Vital Signs Assessment: post-procedure vital signs reviewed and stable Respiratory status: spontaneous breathing, nonlabored ventilation, respiratory function stable and patient connected to nasal cannula oxygen Cardiovascular status: blood pressure returned to baseline and stable Postop Assessment: no apparent nausea or vomiting Anesthetic complications: no   No notable events documented.  Last Vitals:  Vitals:   02/12/24 1045 02/12/24 1115  BP: 127/64 (!) 146/71  Pulse: 75 68  Resp: 16 16  Temp:  36.5 C  SpO2: 96% 95%    Last Pain:  Vitals:   02/12/24 1115  TempSrc:   PainSc: 0-No pain                 Thom JONELLE Peoples

## 2024-02-13 ENCOUNTER — Encounter (HOSPITAL_BASED_OUTPATIENT_CLINIC_OR_DEPARTMENT_OTHER): Payer: Self-pay | Admitting: Orthopaedic Surgery

## 2024-04-15 ENCOUNTER — Other Ambulatory Visit: Payer: Self-pay | Admitting: Physician Assistant

## 2024-04-15 DIAGNOSIS — M19012 Primary osteoarthritis, left shoulder: Secondary | ICD-10-CM

## 2024-04-19 ENCOUNTER — Encounter: Payer: Self-pay | Admitting: Physician Assistant

## 2024-04-22 ENCOUNTER — Ambulatory Visit
Admission: RE | Admit: 2024-04-22 | Discharge: 2024-04-22 | Disposition: A | Discharge status: Home / Self Care | Source: Ambulatory Visit | Attending: Physician Assistant | Admitting: Physician Assistant

## 2024-04-22 DIAGNOSIS — M19012 Primary osteoarthritis, left shoulder: Secondary | ICD-10-CM

## 2024-07-29 ENCOUNTER — Other Ambulatory Visit: Payer: Self-pay

## 2024-07-29 ENCOUNTER — Encounter (HOSPITAL_BASED_OUTPATIENT_CLINIC_OR_DEPARTMENT_OTHER): Payer: Self-pay | Admitting: Orthopaedic Surgery

## 2024-07-30 ENCOUNTER — Encounter (HOSPITAL_BASED_OUTPATIENT_CLINIC_OR_DEPARTMENT_OTHER)
Admission: RE | Admit: 2024-07-30 | Discharge: 2024-07-30 | Disposition: A | Source: Ambulatory Visit | Attending: Orthopaedic Surgery

## 2024-07-30 DIAGNOSIS — Z01818 Encounter for other preprocedural examination: Secondary | ICD-10-CM | POA: Diagnosis present

## 2024-07-30 LAB — SURGICAL PCR SCREEN
MRSA, PCR: NEGATIVE
Staphylococcus aureus: NEGATIVE

## 2024-07-30 LAB — BASIC METABOLIC PANEL WITH GFR
Anion gap: 9 (ref 5–15)
BUN: 21 mg/dL (ref 8–23)
CO2: 29 mmol/L (ref 22–32)
Calcium: 9.5 mg/dL (ref 8.9–10.3)
Chloride: 102 mmol/L (ref 98–111)
Creatinine, Ser: 0.81 mg/dL (ref 0.44–1.00)
GFR, Estimated: >60 mL/min
Glucose, Bld: 89 mg/dL (ref 70–99)
Potassium: 4.6 mmol/L (ref 3.5–5.1)
Sodium: 141 mmol/L (ref 135–145)

## 2024-07-30 NOTE — Progress Notes (Addendum)
 Surgical soap and BPO gel given to patient, instructions given, patient verbalized understanding. Surgical PCR collected-results pending.

## 2024-08-02 NOTE — H&P (Signed)
 "   PREOPERATIVE H&P  Chief Complaint: osteoarthritis of left shoulder  HPI: Pamela Wheeler is a 74 y.o. female who is scheduled for Procedures: ARTHROPLASTY, SHOULDER, TOTAL, REVERSE.   Patient has a past medical history significant for HTN, HLD, OSA.   Patient is well known to us . She had a right shoulder replacement. She has done great with this. She is ready to proceed with her left shoulder.   Symptoms are rated as moderate to severe, and have been worsening.  This is significantly impairing activities of daily living.    Please see clinic note for further details on this patient's care.    She has elected for surgical management.   Past Medical History:  Diagnosis Date   Anxiety    OCD   Arthritis    Barrett's esophagus    Bone spur of other site    spine,  l-5   DJD (degenerative joint disease)    Hyperlipidemia    Hypertension    PONV (postoperative nausea and vomiting)    Sleep apnea    does not use CPAP   Past Surgical History:  Procedure Laterality Date   CHOLECYSTECTOMY  2006   COLONOSCOPY  last 02/06/2011   [facility]   POLYPECTOMY     REVERSE SHOULDER ARTHROPLASTY Right 02/12/2024   Procedure: ARTHROPLASTY, SHOULDER, TOTAL, REVERSE;  Surgeon: Cristy Bonner DASEN, MD;  Location: [facility];  Service: Orthopedics;  Laterality: Right;   TONSILLECTOMY     TOTAL HIP ARTHROPLASTY  06/19/2012   Procedure: TOTAL HIP ARTHROPLASTY ANTERIOR APPROACH;  Surgeon: Lonni CINDERELLA Poli, MD;  Location: [facility];  Service: Orthopedics;  Laterality: Right;  Right Total Hip Arthroplasty, Anterior Approach (C-Arm)   Social History   Socioeconomic History   Marital status: Widowed    Spouse name: Not on file   Number of children: 1   Years of education: Not on file   Highest education level: Not on file  Occupational History   Not on file  Tobacco Use   Smoking status: Never   Smokeless tobacco: Never  Vaping Use   Vaping status: Never Used  Substance and  Sexual Activity   Alcohol use: No   Drug use: No   Sexual activity: Not on file  Other Topics Concern   Not on file  Social History Narrative   Not on file   Social Drivers of Health   Tobacco Use: Low Risk (07/29/2024)   Patient History    Smoking Tobacco Use: Never    Smokeless Tobacco Use: Never    Passive Exposure: Not on file  Financial Resource Strain: Not on file  Food Insecurity: Low Risk (06/08/2024)   Received from [facility]   Epic    Within the past 12 months, you worried that your food would run out before you got money to buy more: Never true    Within the past 12 months, the food you bought just didn't last and you didn't have money to get more. : Never true  Transportation Needs: No Transportation Needs (06/08/2024)   Received from [facility]    In the past 12 months, has lack of reliable transportation kept you from medical appointments, meetings, work or from getting things needed for daily living? : No  Physical Activity: Not on file  Stress: Not on file  Social Connections: Not on file  Depression (EYV7-0): Not on file  Alcohol Screen: Not on file  Housing: Low Risk (06/08/2024)  Received from [facility]   Epic    What is your living situation today?: I have a steady place to live    Think about the place you live. Do you have problems with any of the following? Choose all that apply:: None/None on this list  Utilities: Low Risk (06/08/2024)   Received from [facility]   Utilities    In the past 12 months has the electric, gas, oil, or water company threatened to shut off services in your home? : No  Health Literacy: Not on file   Family History  Problem Relation Age of Onset   Breast cancer Mother        in 83's   Breast cancer Sister 46   Breast cancer Maternal Grandmother    Colon cancer Neg Hx    Colon polyps Neg Hx    Esophageal cancer Neg Hx    Rectal cancer Neg Hx    Stomach cancer Neg Hx    Allergies[1] Prior  to Admission medications  Medication Sig Start Date End Date Taking? Authorizing Provider  alendronate (FOSAMAX) 70 MG tablet Take 70 mg by mouth once a week. Take with a full glass of water on an empty stomach.   Yes [provider]  amLODipine  (NORVASC ) 5 MG tablet Take 5 mg by mouth daily before breakfast.   Yes [provider]  atorvastatin  (LIPITOR) 40 MG tablet Take 40 mg by mouth every evening.   Yes [provider]  b complex vitamins capsule Take 1 capsule by mouth daily.   Yes [provider]  celecoxib  (CELEBREX ) 100 MG capsule Take 100 mg by mouth 2 (two) times daily.   Yes [provider]  escitalopram  (LEXAPRO ) 20 MG tablet Take 20 mg by mouth every evening.   Yes [provider]  metformin (FORTAMET) 500 MG (OSM) 24 hr tablet Take 500 mg by mouth daily with breakfast.   Yes [provider]  omeprazole (PRILOSEC) 20 MG capsule Take 20 mg by mouth daily.   Yes [provider]  Vitamin D-Vitamin K (VITAMIN K2-VITAMIN D3 PO) Take 1 capsule by mouth daily.   Yes [provider]  famotidine  (PEPCID ) 20 MG tablet Take 1 tablet (20 mg total) by mouth 2 (two) times daily. 12/20/22   Charlanne Groom, MD  famotidine  (PEPCID ) 20 MG tablet Take 1 tablet (20 mg total) by mouth 2 (two) times daily. 06/16/23   Charlanne Groom, MD  oxycodone  (OXY-IR) 5 MG capsule Take 5 mg by mouth every 6 (six) hours as needed.    [provider]    ROS: All other systems have been reviewed and were otherwise negative with the exception of those mentioned in the HPI and as above.  Physical Exam: General: Alert, no acute distress Cardiovascular: No pedal edema Respiratory: No cyanosis, no use of accessory musculature GI: No organomegaly, abdomen is soft and non-tender Skin: No lesions in the area of chief complaint Neurologic: Sensation intact distally Psychiatric: Patient is competent for consent with normal mood and  affect Lymphatic: No axillary or cervical lymphadenopathy  MUSCULOSKELETAL:  Examination of the left shoulder demonstrates active forward elevation to 120. ER to 10. Internal rotation to almost back pocket  Imaging: CT of the left shoulder demonstrates end stage glenohumeral osteoarthritis with large inferior humeral head osteophyte. Large cyst noted in the glenoid  BMI: Estimated body mass index is 23.78 kg/m as calculated from the following:   Height as of this encounter: 5' 2 (1.575  m).   Weight as of this encounter: 59 kg.  No results found for: ALBUMIN  Diabetes:   Patient has a diagnosis of diabetes, No results found for: HGBA1C Smoking Status:   reports that she has never smoked. She has never used smokeless tobacco.     Assessment: osteoarthritis of left shoulder  Plan: Plan for Procedures: ARTHROPLASTY, SHOULDER, TOTAL, REVERSE  The risks benefits and alternatives were discussed with the patient including but not limited to the risks of nonoperative treatment, versus surgical intervention including infection, bleeding, nerve injury,  blood clots, cardiopulmonary complications, morbidity, mortality, among others, and they were willing to proceed.   We additionally specifically discussed risks of axillary nerve injury, infection, periprosthetic fracture, continued pain and longevity of implants prior to beginning procedure.    Patient will be closely monitored in PACU for medical stabilization and pain control. If found stable in PACU, patient may be discharged home with outpatient follow-up. If any concerns regarding patient's stabilization patient will be admitted for observation after surgery. The patient is planning to be discharged home with outpatient PT.   The patient acknowledged the explanation, agreed to proceed with the plan and consent was signed.   Operative Plan: L RTSA Discharge Medications: standard DVT Prophylaxis: aspirin  Physical Therapy:  outpatient Special Discharge needs: Sling. IceMan   Aleck LOISE Stalling, PA-C  08/02/2024 12:52 PM     [1]  Allergies Allergen Reactions   Penicillins Other (See Comments)    UNKNOWN   "

## 2024-08-05 ENCOUNTER — Encounter (HOSPITAL_BASED_OUTPATIENT_CLINIC_OR_DEPARTMENT_OTHER): Payer: Self-pay | Admitting: Anesthesiology

## 2024-08-05 ENCOUNTER — Encounter (HOSPITAL_BASED_OUTPATIENT_CLINIC_OR_DEPARTMENT_OTHER): Payer: Self-pay | Admitting: Orthopaedic Surgery

## 2024-08-05 ENCOUNTER — Ambulatory Visit (HOSPITAL_COMMUNITY)

## 2024-08-05 ENCOUNTER — Other Ambulatory Visit: Payer: Self-pay

## 2024-08-05 ENCOUNTER — Ambulatory Visit (HOSPITAL_BASED_OUTPATIENT_CLINIC_OR_DEPARTMENT_OTHER)
Admission: RE | Admit: 2024-08-05 | Discharge: 2024-08-05 | Disposition: A | Source: Home / Self Care | Attending: Orthopaedic Surgery | Admitting: Orthopaedic Surgery

## 2024-08-05 ENCOUNTER — Encounter (HOSPITAL_BASED_OUTPATIENT_CLINIC_OR_DEPARTMENT_OTHER)
Admission: RE | Disposition: A | Discharge status: Home / Self Care | Payer: Self-pay | Source: Home / Self Care | Attending: Orthopaedic Surgery

## 2024-08-05 DIAGNOSIS — E119 Type 2 diabetes mellitus without complications: Secondary | ICD-10-CM

## 2024-08-05 DIAGNOSIS — Z01818 Encounter for other preprocedural examination: Secondary | ICD-10-CM

## 2024-08-05 LAB — GLUCOSE, CAPILLARY
Glucose-Capillary: 89 mg/dL (ref 70–99)
Glucose-Capillary: 123 mg/dL — ABNORMAL HIGH (ref 70–99)

## 2024-08-05 MED ORDER — VANCOMYCIN HCL 1000 MG IV SOLR
INTRAVENOUS | Status: DC | PRN
  Administered 2024-08-05: 1000 mg via TOPICAL

## 2024-08-05 MED ORDER — LACTATED RINGERS IV SOLN: INTRAVENOUS | Status: DC

## 2024-08-05 MED ORDER — GABAPENTIN 300 MG PO CAPS
300.0000 mg | ORAL_CAPSULE | Freq: Once | ORAL | Status: AC
  Administered 2024-08-05: 300 mg via ORAL

## 2024-08-05 MED ORDER — PHENYLEPHRINE HCL-NACL 20-0.9 MG/250ML-% IV SOLN
INTRAVENOUS | Status: DC | PRN
  Administered 2024-08-05: 50 ug/min via INTRAVENOUS

## 2024-08-05 MED ORDER — ONDANSETRON HCL 4 MG/2ML IJ SOLN
INTRAMUSCULAR | Status: AC
  Filled 2024-08-05: qty 2

## 2024-08-05 MED ORDER — ASPIRIN 81 MG PO CHEW: 81.0000 mg | CHEWABLE_TABLET | Freq: Two times a day (BID) | ORAL | 0 refills | Status: AC

## 2024-08-05 MED ORDER — PROPOFOL 10 MG/ML IV BOLUS
INTRAVENOUS | Status: DC | PRN
  Administered 2024-08-05: 100 mg via INTRAVENOUS

## 2024-08-05 MED ORDER — OXYCODONE HCL 5 MG/5ML PO SOLN: 5.0000 mg | Freq: Once | ORAL | Status: DC | PRN

## 2024-08-05 MED ORDER — CEFAZOLIN SODIUM-DEXTROSE 2-4 GM/100ML-% IV SOLN
INTRAVENOUS | Status: AC
  Filled 2024-08-05: qty 100

## 2024-08-05 MED ORDER — CEFAZOLIN SODIUM-DEXTROSE 2-4 GM/100ML-% IV SOLN
2.0000 g | INTRAVENOUS | Status: AC
  Administered 2024-08-05: 2 g via INTRAVENOUS

## 2024-08-05 MED ORDER — ACETAMINOPHEN 500 MG PO TABS
1000.0000 mg | ORAL_TABLET | Freq: Once | ORAL | Status: AC
  Administered 2024-08-05: 1000 mg via ORAL

## 2024-08-05 MED ORDER — ONDANSETRON HCL 4 MG/2ML IJ SOLN
INTRAMUSCULAR | Status: DC | PRN
  Administered 2024-08-05: 4 mg via INTRAVENOUS

## 2024-08-05 MED ORDER — FENTANYL CITRATE (PF) 100 MCG/2ML IJ SOLN
INTRAMUSCULAR | Status: AC
  Filled 2024-08-05: qty 2

## 2024-08-05 MED ORDER — BUPIVACAINE LIPOSOME 1.3 % IJ SUSP
INTRAMUSCULAR | Status: DC | PRN
  Administered 2024-08-05: 10 mL via PERINEURAL

## 2024-08-05 MED ORDER — ROCURONIUM BROMIDE 100 MG/10ML IV SOLN
INTRAVENOUS | Status: DC | PRN
  Administered 2024-08-05: 50 mg via INTRAVENOUS

## 2024-08-05 MED ORDER — GABAPENTIN 300 MG PO CAPS
ORAL_CAPSULE | ORAL | Status: AC
  Filled 2024-08-05: qty 1

## 2024-08-05 MED ORDER — TRANEXAMIC ACID-NACL 1000-0.7 MG/100ML-% IV SOLN
INTRAVENOUS | Status: AC
  Filled 2024-08-05: qty 100

## 2024-08-05 MED ORDER — ROCURONIUM BROMIDE 10 MG/ML (PF) SYRINGE
PREFILLED_SYRINGE | INTRAVENOUS | Status: AC
  Filled 2024-08-05: qty 10

## 2024-08-05 MED ORDER — LIDOCAINE HCL (CARDIAC) PF 100 MG/5ML IV SOSY
PREFILLED_SYRINGE | INTRAVENOUS | Status: DC | PRN
  Administered 2024-08-05: 60 mg via INTRAVENOUS

## 2024-08-05 MED ORDER — FENTANYL CITRATE (PF) 100 MCG/2ML IJ SOLN: 25.0000 ug | INTRAMUSCULAR | Status: DC | PRN

## 2024-08-05 MED ORDER — 0.9 % SODIUM CHLORIDE (POUR BTL) OPTIME
TOPICAL | Status: DC | PRN
  Administered 2024-08-05: 1000 mL

## 2024-08-05 MED ORDER — BUPIVACAINE-EPINEPHRINE (PF) 0.5% -1:200000 IJ SOLN
INTRAMUSCULAR | Status: DC | PRN
  Administered 2024-08-05: 15 mL via PERINEURAL

## 2024-08-05 MED ORDER — FENTANYL CITRATE (PF) 100 MCG/2ML IJ SOLN
INTRAMUSCULAR | Status: DC | PRN
  Administered 2024-08-05: 50 ug via INTRAVENOUS

## 2024-08-05 MED ORDER — DEXAMETHASONE SOD PHOSPHATE PF 10 MG/ML IJ SOLN
INTRAMUSCULAR | Status: AC
  Filled 2024-08-05: qty 1

## 2024-08-05 MED ORDER — TRANEXAMIC ACID-NACL 1000-0.7 MG/100ML-% IV SOLN
1000.0000 mg | INTRAVENOUS | Status: AC
  Administered 2024-08-05: 1000 mg via INTRAVENOUS

## 2024-08-05 MED ORDER — SUGAMMADEX SODIUM 200 MG/2ML IV SOLN
INTRAVENOUS | Status: DC | PRN
  Administered 2024-08-05: 240 mg via INTRAVENOUS

## 2024-08-05 MED ORDER — MIDAZOLAM HCL (PF) 2 MG/2ML IJ SOLN
1.0000 mg | Freq: Once | INTRAMUSCULAR | Status: AC
  Administered 2024-08-05: 1 mg via INTRAVENOUS

## 2024-08-05 MED ORDER — ACETAMINOPHEN 500 MG PO TABS
ORAL_TABLET | ORAL | Status: AC
  Filled 2024-08-05: qty 2

## 2024-08-05 MED ORDER — PROPOFOL 10 MG/ML IV BOLUS
INTRAVENOUS | Status: AC
  Filled 2024-08-05: qty 20

## 2024-08-05 MED ORDER — MIDAZOLAM HCL 2 MG/2ML IJ SOLN
INTRAMUSCULAR | Status: AC
  Filled 2024-08-05: qty 2

## 2024-08-05 MED ORDER — ONDANSETRON 4 MG PO TBDP: 4.0000 mg | ORAL_TABLET | Freq: Three times a day (TID) | ORAL | 0 refills | Status: AC | PRN

## 2024-08-05 MED ORDER — PHENYLEPHRINE HCL (PRESSORS) 10 MG/ML IV SOLN
INTRAVENOUS | Status: DC | PRN
  Administered 2024-08-05: 80 ug via INTRAVENOUS

## 2024-08-05 MED ORDER — OXYCODONE HCL 5 MG PO TABS: ORAL_TABLET | ORAL | 0 refills | Status: AC

## 2024-08-05 MED ORDER — OXYCODONE HCL 5 MG PO TABS: 5.0000 mg | ORAL_TABLET | Freq: Once | ORAL | Status: DC | PRN

## 2024-08-05 MED ORDER — DROPERIDOL 2.5 MG/ML IJ SOLN: 0.6250 mg | Freq: Once | INTRAMUSCULAR | Status: DC | PRN

## 2024-08-05 MED ORDER — LIDOCAINE 2% (20 MG/ML) 5 ML SYRINGE
INTRAMUSCULAR | Status: AC
  Filled 2024-08-05: qty 5

## 2024-08-05 MED ORDER — FENTANYL CITRATE (PF) 100 MCG/2ML IJ SOLN
50.0000 ug | Freq: Once | INTRAMUSCULAR | Status: AC
  Administered 2024-08-05: 50 ug via INTRAVENOUS

## 2024-08-05 MED ORDER — CELECOXIB 100 MG PO CAPS: 100.0000 mg | ORAL_CAPSULE | Freq: Two times a day (BID) | ORAL | 0 refills | Status: AC

## 2024-08-05 MED ORDER — ACETAMINOPHEN 500 MG PO TABS: 1000.0000 mg | ORAL_TABLET | Freq: Three times a day (TID) | ORAL | 0 refills | Status: AC

## 2024-08-05 MED ORDER — DEXAMETHASONE SODIUM PHOSPHATE 4 MG/ML IJ SOLN
INTRAMUSCULAR | Status: DC | PRN
  Administered 2024-08-05: 5 mg via INTRAVENOUS

## 2024-08-05 MED ORDER — VANCOMYCIN HCL 1000 MG IV SOLR
INTRAVENOUS | Status: AC
  Filled 2024-08-05: qty 20

## 2024-08-05 NOTE — Op Note (Signed)
 Orthopaedic Surgery Operative Note (CSN: [id number])  [name]  08-12-1950 Date of Surgery: 08/05/2024   Diagnoses:  osteoarthritis of left shoulder  Procedure: Left reverse  Total Shoulder Arthroplasty   Operative Finding Successful completion of planned procedure.  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  We were able to bone graft this and used a press-fit post to obtain negative bone to implant interacting and try and prevent loosening at a later date.  The remainder the case was quite typical.  Post-operative plan: The patient will be NWB in sling.  The patient will be will be discharged from PACU if continues to be stable as was plan prior to surgery.  DVT prophylaxis Aspirin  81 mg twice daily for 6 weeks.  Pain control with PRN pain medication preferring oral medicines.  Follow up plan will be scheduled in approximately 7 days for incision check and XR.  Physical therapy to start after 4 weeks for aggressive motion however she can do 1 educational session at the beginning of her postop course.  Implants: Tornier perform humeral size 2 long stem, +3 retentive polyethylene, 33+3 glenosphere with a 25+3 baseplate, short post and 4 peripheral screws  Post-Op Diagnosis: Same Surgeons:Primary: Cristy Bonner DASEN, MD Assistants:Caroline McBane, PA-C Location: [facility] OR ROOM 1 Anesthesia: General with Exparel  Interscalene Antibiotics: Ancef  2g preop, Vancomycin  1000mg  locally Tourniquet time: None Estimated Blood Loss: 100 Complications: None Specimens: None Implants: Implant Name Type Inv. Item Serial No. Manufacturer Lot No. LRB No. Used Action  SCREW BONE INTRNL SM 7 - [id number] Screw SCREW BONE INTRNL SM 7 [id number] [employer]  Left 1 Implanted  HEAD CANN REV SHLD PERF 33 +3 - [id number] Head HEAD CANN REV SHLD PERF 33 +3 [id number] [employer]  Left 1 Implanted  BASEPLATE GLENOID RSA 3X25 0D -  [id number] Shoulder BASEPLATE GLENOID RSA 3X25 0D [id number] [employer]  Left 1 Implanted  STEM HUMERAL PLUS LONG SZ2 - [id number] Orthopedic Implant STEM HUMERAL PLUS LONG SZ2 [id number] [employer]  Left 1 Implanted  tornier perform humeral reverse   [id number]   Left 1 Implanted  SCREW PERIPHERAL 30 - [id number] Screw SCREW PERIPHERAL 30  [employer] ON SET Left 1 Implanted  SCREW 5.5X22 - [id number] Screw SCREW 5.5X22  [employer] ON SET Left 2 Implanted  SCREW 5.0X18 - [id number] Screw SCREW 5.0X18  [employer]  Left 1 Implanted    Indications for Surgery:   Pamela Wheeler is a 74 y.o. female with end-stage arthritis and a large glenoid cyst.  Benefits and risks of operative and nonoperative management were discussed prior to surgery with patient/guardian(s) and informed consent form was completed.  Infection and need for further surgery were discussed as was prosthetic stability and cuff issues.  We additionally specifically discussed risks of axillary nerve injury, infection, periprosthetic fracture, continued pain and longevity of implants prior to beginning procedure.      Procedure:   The patient was identified in the preoperative holding area where the surgical site was marked. Block placed by anesthesia with exparel .  The patient was taken to the OR where a procedural timeout was called and the above noted anesthesia was induced.  The patient was positioned beachchair on allen table with spider arm positioner.  Preoperative antibiotics were dosed.  The patient's left shoulder was prepped and draped in the usual sterile fashion.  A second preoperative timeout was called.  Standard deltopectoral approach was performed with a #10 blade. We dissected down to the subcutaneous tissues and the cephalic vein was taken laterally with the deltoid. Clavipectoral fascia was incised in line with the incision. Deep retractors were placed. The long of the biceps tendon was identified and  there was significant tenosynovitis present.  Tenodesis was performed to the pectoralis tendon with #2 Ethibond. The remaining biceps was followed up into the rotator interval where it was released.   The subscapularis was taken down in a full thickness layer with capsule along the humeral neck extending inferiorly around the humeral head. We continued releasing the capsule directly off of the osteophytes inferiorly all the way around the corner. This allowed us  to dislocate the humeral head.   There were osteophytes along the inferior humeral neck. The osteophytes were removed with an osteotome and a rongeur.  Osteophytes were removed with a rongeur and an osteotome and the anatomic neck was well visualized.     A humeral cutting guide was used extra medullary with a pin to help control version. The version was set at 20 of retroversion. Humeral osteotomy was performed with an oscillating saw. The head fragment was passed off the back table.  A cut protector plate was placed.  The subscapularis was again identified and immediately we took care to palpate the axillary nerve anteriorly and verify its position with gentle palpation as well as the tug test.  We then released the SGHL with bovie cautery prior to placing a curved mayo at the junction of the anterior glenoid well above the axillary nerve and bluntly dissecting the subscapularis from the capsule.  We then carefully protected the axillary nerve as we gently released the inferior capsule to fully mobilize the subscapularis.  An anterior deltoid retractor was then placed as well as a small Hohmann retractor superiorly.   The remaining labrum was removed circumferentially taking great care not to disrupt the posterior capsule.   The glenoid drill guide was placed and used to drill a guide pin in the center, inferior position. The glenoid face was then reamed concentrically over the guide wire. The center hole was drilled over the guidepin in a  near anatomic angle of version. Next the glenoid vault was drilled back and the vault was intact however there was a large glenoid cyst as we expected based on our 3D planning.  We used bone graft from the humeral head and packed it in the area and then redrilled our boss and a short post drill.  We were able to place our baseplate with a short post with great fixation and good baseplate seating with nearly 100% with 4 peripheral screws to augment our fixation.  The base plate was screwed into the glenoid vault obtaining secure fixation. We next placed superior and inferior locking screws for additional fixation.  Glenosphere was attached as above size list.  The center screw was tightened.  We turned attention back to the humeral side. The cut protector was removed.  We then reamed for the central ball of the implant.  We sized as above.  Trial was obtained good stability and we selected real implants.  The shoulder was trialed.  There was good ROM in all planes and the shoulder was stable with no inferior translation.  The real humeral implants were opened after again confirming sizes.  The trial was removed. #5 Fiberwire x4 sutures passed through the humeral neck for subscap repair. The humeral component was press-fit obtaining a secure  fit. The joint was reduced and thoroughly irrigated with pulsatile lavage. Subscap was repaired back with #5 Fiberwire sutures through bone tunnels. Hemostasis was obtained. The deltopectoral interval was reapproximated with #1 Ethibond. The subcutaneous tissues were closed with 2-0 Vicryl and the skin was closed with running nylon.    The wounds were cleaned and dried and an Aquacel dressing was placed. The drapes taken down. The arm was placed into sling with abduction pillow. Patient was awakened, extubated, and transferred to the recovery room in stable condition. There were no intraoperative complications. The sponge, needle, and attention counts were  correct at the  end of the case.     Aleck Stalling, PA-C, present and scrubbed throughout the case, critical for completion in a timely fashion, and for retraction, instrumentation, closure.

## 2024-08-05 NOTE — Anesthesia Postprocedure Evaluation (Signed)
"   Anesthesia Post Note  Patient: Pamela Wheeler  Procedure(s) Performed: ARTHROPLASTY, SHOULDER, TOTAL, REVERSE (Left: Shoulder)     Patient location during evaluation: PACU Anesthesia Type: General Level of consciousness: awake and alert Pain management: pain level controlled Vital Signs Assessment: post-procedure vital signs reviewed and stable Respiratory status: spontaneous breathing, nonlabored ventilation and respiratory function stable Cardiovascular status: blood pressure returned to baseline Postop Assessment: no apparent nausea or vomiting Anesthetic complications: no   No notable events documented.  Last Vitals:  Vitals:   08/05/24 1000 08/05/24 1015  BP: 129/65 130/68  Pulse: 73 78  Resp: 13 15  Temp:    SpO2: 99% 99%    Last Pain:  Vitals:   08/05/24 1000  TempSrc:   PainSc: 0-No pain   Pain Goal:                   Vertell Row      "

## 2024-08-05 NOTE — Discharge Instructions (Addendum)
 Bonner Hair MD, MPH Aleck Stalling, PA-C [facility] [address] [phone] (tel)   [phone] (fax)   POST-OPERATIVE INSTRUCTIONS - TOTAL SHOULDER REPLACEMENT    WOUND CARE You may leave the operative dressing in place until your follow-up appointment. KEEP THE INCISIONS CLEAN AND DRY. There may be a small amount of fluid/bleeding leaking at the surgical site. This is normal after surgery.  If it fills with liquid or blood please call us  immediately to change it for you. Use the provided ice machine or Ice packs as often as possible for the first 3-4 days, then as needed for pain relief.   Keep a layer of cloth or a shirt between your skin and the cooling unit to prevent frost bite as it can get very cold.  SHOWERING: - You may shower on Post-Op Day #2.  - The dressing is water resistant but do not scrub it as it may start to peel up.   - You may remove the sling for showering - Gently pat the area dry.  - Do not soak the shoulder in water.  - Do not go swimming in the pool or ocean until your incision has completely healed (about 4-6 weeks after surgery) - KEEP THE INCISIONS CLEAN AND DRY.   EXERCISES Wear the sling at all times  You may remove the sling for showering, but keep the arm across the chest or in a secondary sling.    Accidental/Purposeful External Rotation and shoulder flexion (reaching behind you) is to be avoided at all costs for the first month. It is ok to come out of your sling if your are sitting and have assistance for eating.   Do not lift anything heavier than 1 pound until we discuss it further in clinic.  It is normal for your fingers/hand to become more swollen after surgery and discolored from bruising.   This will resolve over the first few weeks usually after surgery. Please continue to ambulate and do not stay sitting or lying for too long.  Perform foot and wrist pumps to assist in circulation.  PHYSICAL  THERAPY - You will begin physical therapy soon after surgery (unless otherwise specified) - Please call to set up an appointment, if you do not already have one  - Let our office if there are any issues with scheduling your therapy    REGIONAL ANESTHESIA (NERVE BLOCKS) The anesthesia team may have performed a nerve block for you this is a great tool used to minimize pain.   The block may start wearing off overnight (between 8-24 hours postop) When the block wears off, your pain may go from nearly zero to the pain you would have had postop without the block. This is an abrupt transition but nothing dangerous is happening.   This can be a challenging period but utilize your as needed pain medications to try and manage this period. We suggest you use the pain medication the first night prior to going to bed, to ease this transition.  You may take an extra dose of narcotic when this happens if needed   POST-OP MEDICATIONS- Multimodal approach to pain control In general your pain will be controlled with a combination of substances.  Prescriptions unless otherwise discussed are electronically sent to your pharmacy.  This is a carefully made plan we use to minimize narcotic use.     Celebrex  - Anti-inflammatory medication taken on a scheduled basis Acetaminophen  - Non-narcotic pain medicine taken on a  scheduled basis  Oxycodone  - This is a strong narcotic, to be used only on an as needed basis for SEVERE pain. Aspirin  81mg  - This medicine is used to minimize the risk of blood clots after surgery. Zofran  -  take as needed for nausea   FOLLOW-UP If you develop a Fever (>101.5), Redness or Drainage from the surgical incision site, please call our office to arrange for an evaluation. Please call the office to schedule a follow-up appointment for a wound check, 7-10 days post-operatively.  IF YOU HAVE ANY QUESTIONS, PLEASE FEEL FREE TO CALL OUR OFFICE.  HELPFUL INFORMATION  Your arm will be  in a sling following surgery. You will be in this sling for the next 4 weeks.   You may be more comfortable sleeping in a semi-seated position the first few nights following surgery.  Keep a pillow propped under the elbow and forearm for comfort.  If you have a recliner type of chair it might be beneficial.  If not that is fine too, but it would be helpful to sleep propped up with pillows behind your operated shoulder as well under your elbow and forearm.  This will reduce pulling on the suture lines.  When dressing, put your operative arm in the sleeve first.  When getting undressed, take your operative arm out last.  Loose fitting, button-down shirts are recommended.  In most states it is against the law to drive while your arm is in a sling. And certainly against the law to drive while taking narcotics.  You may return to work/school in the next couple of days when you feel up to it. Desk work and typing in the sling is fine.  We suggest you use the pain medication the first night prior to going to bed, in order to ease any pain when the anesthesia wears off. You should avoid taking pain medications on an empty stomach as it will make you nauseous.  You should wean off your narcotic medicines as soon as you are able.     Most patients will be off narcotics before their first postop appointment.   Do not drink alcoholic beverages or take illicit drugs when taking pain medications.  Pain medication may make you constipated.  Below are a few solutions to try in this order: Decrease the amount of pain medication if you arent having pain. Drink lots of decaffeinated fluids. Drink prune juice and/or each dried prunes  If the first 3 dont work start with additional solutions Take Colace - an over-the-counter stool softener Take Senokot - an over-the-counter laxative Take Miralax - a stronger over-the-counter laxative   Dental Antibiotics:  We require dental prophylaxis for 2 years after a  shoulder replacement  Contact your surgeon for an antibiotic prescription, prior to your dental procedure.   For more information including helpful videos and documents visit our website:   [email or web address]   No Tylenol  until after 1pm today if needed  Post Anesthesia Home Care Instructions  Activity: Get plenty of rest for the remainder of the day. A responsible individual must stay with you for 24 hours following the procedure.  For the next 24 hours, DO NOT: -Drive a car -Advertising copywriter -Drink alcoholic beverages -Take any medication unless instructed by your physician -Make any legal decisions or sign important papers.  Meals: Start with liquid foods such as gelatin or soup. Progress to regular foods as tolerated. Avoid greasy, spicy, heavy foods. If nausea and/or vomiting occur, drink only clear liquids until  the nausea and/or vomiting subsides. Call your physician if vomiting continues.  Special Instructions/Symptoms: Your throat may feel dry or sore from the anesthesia or the breathing tube placed in your throat during surgery. If this causes discomfort, gargle with warm salt water. The discomfort should disappear within 24 hours.  If you had a scopolamine patch placed behind your ear for the management of post- operative nausea and/or vomiting:  1. The medication in the patch is effective for 72 hours, after which it should be removed.  Wrap patch in a tissue and discard in the trash. Wash hands thoroughly with soap and water. 2. You may remove the patch earlier than 72 hours if you experience unpleasant side effects which may include dry mouth, dizziness or visual disturbances. 3. Avoid touching the patch. Wash your hands with soap and water after contact with the patch.    Regional Anesthesia Blocks  1. You may not be able to move or feel the blocked extremity after a regional anesthetic block. This may last may last from 3-48  hours after placement, but it will go away. The length of time depends on the medication injected and your individual response to the medication. As the nerves start to wake up, you may experience tingling as the movement and feeling returns to your extremity. If the numbness and inability to move your extremity has not gone away after 48 hours, please call your surgeon.   2. The extremity that is blocked will need to be protected until the numbness is gone and the strength has returned. Because you cannot feel it, you will need to take extra care to avoid injury. Because it may be weak, you may have difficulty moving it or using it. You may not know what position it is in without looking at it while the block is in effect.  3. For blocks in the legs and feet, returning to weight bearing and walking needs to be done carefully. You will need to wait until the numbness is entirely gone and the strength has returned. You should be able to move your leg and foot normally before you try and bear weight or walk. You will need someone to be with you when you first try to ensure you do not fall and possibly risk injury.  4. Bruising and tenderness at the needle site are common side effects and will resolve in a few days.  5. Persistent numbness or new problems with movement should be communicated to the surgeon or the [facility] [phone] [facility] [phone]).  Information for Discharge Teaching: EXPAREL  (bupivacaine  liposome injectable suspension)   Pain relief is important to your recovery. The goal is to control your pain so you can move easier and return to your normal activities as soon as possible after your procedure. Your physician may use several types of medicines to manage pain, swelling, and more.  Your surgeon or anesthesiologist gave you EXPAREL (bupivacaine ) to help control your pain after surgery.  EXPAREL  is a local anesthetic designed to release slowly  over an extended period of time to provide pain relief by numbing the tissue around the surgical site. EXPAREL  is designed to release pain medication over time and can control pain for up to 72 hours. Depending on how you respond to EXPAREL , you may require less pain medication during your recovery. EXPAREL  can help reduce or eliminate the need for opioids during the first few days after surgery when pain relief is needed the most.  EXPAREL  is not an opioid and is not addictive. It does not cause sleepiness or sedation.   Important! A teal colored band has been placed on your arm with the date, time and amount of EXPAREL  you have received. Please leave this armband in place for the full 96 hours following administration, and then you may remove the band. If you return to the hospital for any reason within 96 hours following the administration of EXPAREL , the armband provides important information that your health care providers to know, and alerts them that you have received this anesthetic.    Possible side effects of EXPAREL : Temporary loss of sensation or ability to move in the area where medication was injected. Nausea, vomiting, constipation Rarely, numbness and tingling in your mouth or lips, lightheadedness, or anxiety may occur. Call your doctor right away if you think you may be experiencing any of these sensations, or if you have other questions regarding possible side effects.  Follow all other discharge instructions given to you by your surgeon or nurse. Eat a healthy diet and drink plenty of water or other fluids.

## 2024-08-05 NOTE — Anesthesia Preprocedure Evaluation (Addendum)
"                                    Anesthesia Evaluation  Patient identified by MRN, date of birth, ID band Patient awake    Reviewed: Allergy & Precautions, NPO status , Patient's Chart, lab work & pertinent test results  History of Anesthesia Complications Negative for: history of anesthetic complications  Airway Mallampati: II  TM Distance: >3 FB Neck ROM: Full    Dental no notable dental hx.    Pulmonary sleep apnea    Pulmonary exam normal        Cardiovascular hypertension, Pt. on medications Normal cardiovascular exam     Neuro/Psych  Headaches  Anxiety        GI/Hepatic Neg liver ROS,GERD  Medicated,,  Endo/Other  negative endocrine ROS    Renal/GU negative Renal ROS     Musculoskeletal  (+) Arthritis ,    Abdominal   Peds  Hematology negative hematology ROS (+)   Anesthesia Other Findings   Reproductive/Obstetrics                              Anesthesia Physical Anesthesia Plan  ASA: 2  Anesthesia Plan: General   Post-op Pain Management: Tylenol  PO (pre-op)* and Regional block*   Induction: Intravenous  PONV Risk Score and Plan: 4 or greater and Treatment may vary due to age or medical condition, Ondansetron , Dexamethasone , Midazolam  and Propofol  infusion  Airway Management Planned: Oral ETT  Additional Equipment: None  Intra-op Plan:   Post-operative Plan: Extubation in OR  Informed Consent: I have reviewed the patients History and Physical, chart, labs and discussed the procedure including the risks, benefits and alternatives for the proposed anesthesia with the patient or authorized representative who has indicated his/her understanding and acceptance.     Dental advisory given  Plan Discussed with: CRNA  Anesthesia Plan Comments:          Anesthesia Quick Evaluation  "

## 2024-08-05 NOTE — Anesthesia Procedure Notes (Signed)
 Anesthesia Regional Block: Interscalene brachial plexus block   Pre-Anesthetic Checklist: , timeout performed,  Correct Patient, Correct Site, Correct Laterality,  Correct Procedure, Correct Position, site marked,  Risks and benefits discussed,  Pre-op evaluation,  At surgeon's request and post-op pain management  Laterality: Left  Prep: Maximum Sterile Barrier Precautions used, chloraprep       Needles:  Injection technique: Single-shot  Needle Type: Echogenic Stimulator Needle     Needle Length: 4cm  Needle Gauge: 22     Additional Needles:   Procedures:,,,, ultrasound used (permanent image in chart),,    Narrative:  Start time: 08/05/2024 7:45 AM End time: 08/05/2024 7:48 AM Injection made incrementally with aspirations every 5 mL.  Performed by: Personally  Anesthesiologist: Paul Lamarr BRAVO, MD  Additional Notes: Risks, benefits, and alternative discussed. Patient gave consent for procedure. Patient prepped and draped in sterile fashion. Sedation administered, patient remains easily responsive to voice. Relevant anatomy identified with ultrasound guidance. Local anesthetic given in 5cc increments with no signs or symptoms of intravascular injection. No pain or paraesthesias with injection. Patient monitored throughout procedure with no signs of LAST or immediate complications. Tolerated well. Ultrasound image placed in chart.  LANEY Paul, MD

## 2024-08-05 NOTE — Transfer of Care (Signed)
 Immediate Anesthesia Transfer of Care Note  Patient: Pamela Wheeler  Procedure(s) Performed: ARTHROPLASTY, SHOULDER, TOTAL, REVERSE (Left: Shoulder)  Patient Location: PACU  Anesthesia Type:General and Regional  Level of Consciousness: awake, alert , and patient cooperative  Airway & Oxygen Therapy: Patient Spontanous Breathing and Patient connected to face mask oxygen  Post-op Assessment: Report given to RN and Post -op Vital signs reviewed and stable  Post vital signs: Reviewed and stable  Last Vitals:  Vitals Value Taken Time  BP 148/66 08/05/24 09:39  Temp    Pulse 78 08/05/24 09:41  Resp 11 08/05/24 09:41  SpO2 98 % 08/05/24 09:41  Vitals shown include unfiled device data.  Last Pain:  Vitals:   08/05/24 0659  TempSrc: Temporal  PainSc: 0-No pain         Complications: No notable events documented.

## 2024-08-05 NOTE — Interval H&P Note (Signed)
 All questions answered, patient wants to proceed with procedure. ? ?

## 2024-08-05 NOTE — Anesthesia Procedure Notes (Signed)
 Procedure Name: Intubation Date/Time: 08/05/2024 8:21 AM  Performed by: Burnard Rosaline HERO, CRNAPre-anesthesia Checklist: Patient identified, Emergency Drugs available, Suction available and Patient being monitored Patient Re-evaluated:Patient Re-evaluated prior to induction Oxygen Delivery Method: Circle system utilized Preoxygenation: Pre-oxygenation with 100% oxygen Induction Type: IV induction Ventilation: Mask ventilation without difficulty Laryngoscope Size: Mac and 4 Grade View: Grade I Tube type: Oral Tube size: 7.0 mm Number of attempts: 1 Airway Equipment and Method: Stylet and Oral airway Placement Confirmation: ETT inserted through vocal cords under direct vision, positive ETCO2, breath sounds checked- equal and bilateral and CO2 detector Secured at: 21 cm Tube secured with: Tape Dental Injury: Teeth and Oropharynx as per pre-operative assessment

## 2024-08-05 NOTE — Progress Notes (Signed)
 Assisted Dr. Stephannie Peters with left, interscalene , ultrasound guided block. Side rails up, monitors on throughout procedure. See vital signs in flow sheet. Tolerated Procedure well.
